# Patient Record
Sex: Male | Born: 1940 | Race: White | Hispanic: No | Marital: Married | Smoking: Never smoker
Health system: Southern US, Community
[De-identification: ages and names within clinical notes are randomized; demographics above are authoritative.]

## PROBLEM LIST (undated history)

## (undated) DIAGNOSIS — Z9289 Personal history of other medical treatment: Secondary | ICD-10-CM

## (undated) DIAGNOSIS — R7303 Prediabetes: Secondary | ICD-10-CM

## (undated) DIAGNOSIS — H269 Unspecified cataract: Secondary | ICD-10-CM

## (undated) DIAGNOSIS — I10 Essential (primary) hypertension: Secondary | ICD-10-CM

## (undated) DIAGNOSIS — I471 Supraventricular tachycardia, unspecified: Secondary | ICD-10-CM

## (undated) DIAGNOSIS — E119 Type 2 diabetes mellitus without complications: Secondary | ICD-10-CM

## (undated) DIAGNOSIS — Z973 Presence of spectacles and contact lenses: Secondary | ICD-10-CM

## (undated) DIAGNOSIS — E7800 Pure hypercholesterolemia, unspecified: Secondary | ICD-10-CM

## (undated) DIAGNOSIS — M5126 Other intervertebral disc displacement, lumbar region: Secondary | ICD-10-CM

## (undated) HISTORY — PX: TONSILLECTOMY: SUR1361

## (undated) HISTORY — PX: COLONOSCOPY: SHX174

---

## 2006-09-20 ENCOUNTER — Ambulatory Visit: Payer: Self-pay | Admitting: Internal Medicine

## 2006-10-04 ENCOUNTER — Ambulatory Visit: Payer: Self-pay | Admitting: Internal Medicine

## 2011-06-06 DIAGNOSIS — Z9289 Personal history of other medical treatment: Secondary | ICD-10-CM

## 2011-06-06 HISTORY — DX: Personal history of other medical treatment: Z92.89

## 2012-04-29 ENCOUNTER — Encounter: Payer: Self-pay | Admitting: Cardiology

## 2012-04-29 ENCOUNTER — Ambulatory Visit (INDEPENDENT_AMBULATORY_CARE_PROVIDER_SITE_OTHER): Payer: Medicare Other | Admitting: Cardiology

## 2012-04-29 VITALS — BP 132/68 | HR 72 | Resp 18 | Ht 68.0 in | Wt 204.0 lb

## 2012-04-29 DIAGNOSIS — E7800 Pure hypercholesterolemia, unspecified: Secondary | ICD-10-CM

## 2012-04-29 DIAGNOSIS — R0989 Other specified symptoms and signs involving the circulatory and respiratory systems: Secondary | ICD-10-CM

## 2012-04-29 DIAGNOSIS — I4949 Other premature depolarization: Secondary | ICD-10-CM

## 2012-04-29 DIAGNOSIS — I493 Ventricular premature depolarization: Secondary | ICD-10-CM

## 2012-04-29 DIAGNOSIS — I119 Hypertensive heart disease without heart failure: Secondary | ICD-10-CM | POA: Insufficient documentation

## 2012-04-29 DIAGNOSIS — R0609 Other forms of dyspnea: Secondary | ICD-10-CM | POA: Insufficient documentation

## 2012-04-29 NOTE — Progress Notes (Signed)
James Jacobs Date of Birth:  01-30-1941 [facility] [address] [place], [place]  [address] [phone]         Fax   [phone]  History of Present Illness: This pleasant 71 year old gentleman is seen at the request of Dr. Waynard Edwards for evaluation of exertional dyspnea and multiple cardiac risk factors.  The patient has been in good general health.  He splits his time between living here in [place] and living in [place].  He has a past history of high blood pressure which was diagnosed at age 15.  He is also diabetic and has a history of hypercholesterolemia.  He has never smoked cigarettes.  He exercises by working in the yard and when he is in [place] he also rides a bike on level terrain.  He has been experiencing some exertional dyspnea but no exertional chest pain.  He states that his last stress test was about 20 years ago elsewhere.  He states that while in [place] this summer he was checked for an abdominal aortic aneurysm with an ultrasound because several family members have had abdominal aortic aneurysms.  His evaluation was negative however.  Current Outpatient Prescriptions  Medication Sig Dispense Refill  . ALPRAZolam (XANAX) 0.25 MG tablet Take 0.25 mg by mouth at bedtime as needed.      Marland Kitchen amLODipine (NORVASC) 10 MG tablet Take 10 mg by mouth daily.      . Ascorbic Acid (C-1000 SR) 1000 MG TBCR Take 1 tablet by mouth daily.      Marland Kitchen aspirin 81 MG tablet Take 81 mg by mouth daily.      . cholecalciferol (VITAMIN D) 1000 UNITS tablet Take 1,000 Units by mouth daily.      Marland Kitchen ezetimibe (ZETIA) 10 MG tablet Take 10 mg by mouth daily.      . fish oil-omega-3 fatty acids 1000 MG capsule Take 2 g by mouth daily.      . hydrochlorothiazide (HYDRODIURIL) 25 MG tablet Take 25 mg by mouth daily.      . Multiple Vitamin (MULTIVITAMIN WITH MINERALS) TABS Take 1 tablet by mouth daily.      . potassium chloride (K-DUR) 10 MEQ tablet Take 10 mEq by mouth 2 (two)  times daily.      . quinapril (ACCUPRIL) 40 MG tablet Take 40 mg by mouth at bedtime.      . tadalafil (CIALIS) 5 MG tablet Take 5 mg by mouth daily as needed.        No Known Allergies  Patient Active Problem List  Diagnosis  . Dyspnea on exertion  . Pure hypercholesterolemia  . Benign hypertensive heart disease without heart failure    History  Smoking status  . Never Smoker   Smokeless tobacco  . Not on file    History  Alcohol Use: Not on file    No family history on file.  Review of Systems: Constitutional: no fever chills diaphoresis or fatigue or change in weight.  Head and neck: no hearing loss, no epistaxis, no photophobia or visual disturbance. Respiratory: No cough, shortness of breath or wheezing. Cardiovascular: No chest pain peripheral edema, palpitations. Gastrointestinal: No abdominal distention, no abdominal pain, no change in bowel habits hematochezia or melena. Genitourinary: No dysuria, no frequency, no urgency, no nocturia. Musculoskeletal:No arthralgias, no back pain, no gait disturbance or myalgias. Neurological: No dizziness, no headaches, no numbness, no seizures, no syncope, no weakness, no tremors. Hematologic: No lymphadenopathy, no easy bruising. Psychiatric:  No confusion, no hallucinations, no sleep disturbance.    Physical Exam: Filed Vitals:   04/29/12 1149  BP: 132/68  Pulse: 72  Resp: 18   the general appearance reveals a pleasant slightly overweight gentleman in no acute distress.The head and neck exam reveals pupils equal and reactive.  Extraocular movements are full.  There is no scleral icterus.  The mouth and pharynx are normal.  The neck is supple.  The carotids reveal no bruits.  The jugular venous pressure is normal.  The  thyroid is not enlarged.  There is no lymphadenopathy.  The chest is clear to percussion and auscultation.  There are no rales or rhonchi.  Expansion of the chest is symmetrical.  The precordium is quiet.  The  heart rhythm is irregular with occasional PVCs  The first heart sound is normal.  The second heart sound is physiologically split.  There is no murmur gallop rub or click.  There is no abnormal lift or heave.  The abdomen is soft and nontender.  The bowel sounds are normal.  The liver and spleen are not enlarged.  There are no abdominal masses.  There are no abdominal bruits.  Extremities reveal good pedal pulses.  There is no phlebitis or edema.  There is no cyanosis or clubbing.  Strength is normal and symmetrical in all extremities.  There is no lateralizing weakness.  There are no sensory deficits.  The skin is warm and dry.  There is no rash.  EKG today shows normal sinus rhythm with occasional PVC.  There are no ischemic changes at rest  Assessment / Plan: This gentleman has been experiencing exertional dyspnea.  Risk factors for premature coronary disease include a potential, hypercholesterolemia, and diabetes.  Examination reveals asymptomatic PVCs.  Plan: In regard to the PVCs, the patient has been drinking a moderate amount of by his T. and he will switch to water. We'll have the patient return for a treadmill Myoview stress test in the near future to evaluate his dyspnea further.  Many thanks for the opportunity to see this pleasant gentleman.

## 2012-04-29 NOTE — Patient Instructions (Signed)
Decrease your tea intake  Your physician recommends that you continue on your current medications as directed. Please refer to the Current Medication list given to you today.   Your physician has requested that you have en exercise stress myoview. For further information please visit [email or web address] Please follow instruction sheet, as given.

## 2012-05-01 ENCOUNTER — Other Ambulatory Visit: Payer: Self-pay | Admitting: Cardiovascular Disease

## 2012-05-01 ENCOUNTER — Ambulatory Visit (HOSPITAL_COMMUNITY): Payer: Medicare Other | Attending: Cardiovascular Disease | Admitting: Radiology

## 2012-05-01 VITALS — BP 148/74 | HR 66 | Ht 68.5 in | Wt 204.0 lb

## 2012-05-01 DIAGNOSIS — R079 Chest pain, unspecified: Secondary | ICD-10-CM

## 2012-05-01 DIAGNOSIS — I2119 ST elevation (STEMI) myocardial infarction involving other coronary artery of inferior wall: Secondary | ICD-10-CM

## 2012-05-01 DIAGNOSIS — E7800 Pure hypercholesterolemia, unspecified: Secondary | ICD-10-CM

## 2012-05-01 DIAGNOSIS — R0602 Shortness of breath: Secondary | ICD-10-CM

## 2012-05-01 DIAGNOSIS — R0609 Other forms of dyspnea: Secondary | ICD-10-CM

## 2012-05-01 DIAGNOSIS — R0989 Other specified symptoms and signs involving the circulatory and respiratory systems: Secondary | ICD-10-CM | POA: Insufficient documentation

## 2012-05-01 DIAGNOSIS — E119 Type 2 diabetes mellitus without complications: Secondary | ICD-10-CM | POA: Insufficient documentation

## 2012-05-01 DIAGNOSIS — I119 Hypertensive heart disease without heart failure: Secondary | ICD-10-CM

## 2012-05-01 DIAGNOSIS — I10 Essential (primary) hypertension: Secondary | ICD-10-CM | POA: Insufficient documentation

## 2012-05-01 DIAGNOSIS — R002 Palpitations: Secondary | ICD-10-CM | POA: Insufficient documentation

## 2012-05-01 MED ORDER — TECHNETIUM TC 99M SESTAMIBI GENERIC - CARDIOLITE
30.0000 | Freq: Once | INTRAVENOUS | Status: AC | PRN
  Administered 2012-05-01: 30 via INTRAVENOUS

## 2012-05-01 MED ORDER — TECHNETIUM TC 99M SESTAMIBI GENERIC - CARDIOLITE
10.0000 | Freq: Once | INTRAVENOUS | Status: AC | PRN
  Administered 2012-05-01: 10 via INTRAVENOUS

## 2012-05-01 MED ORDER — METOPROLOL TARTRATE 25 MG PO TABS: 25.0000 mg | ORAL_TABLET | Freq: Two times a day (BID) | ORAL | Status: DC

## 2012-05-01 NOTE — Progress Notes (Signed)
Mr. 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  He has mildly decreased uptake in the inferior wall.  The defect is mostly fixed with a small amount of redistribution.  He has not had any angina.  Will start Metoprolol 25 mg BID.  He will follow up with [place] in the next month or so.   He will call for any further symptoms.   Vesta Mixer, Montez Hageman., MD, [facility] 05/01/2012, 4:51 PM Office - [phone] Pager [phone]

## 2012-05-01 NOTE — Progress Notes (Signed)
[facility] SITE 3 NUCLEAR MED [address] [id number] [place] [address] [phone]  Cardiology Nuclear Med Study  James Jacobs is a 71 y.o. male     MRN : [id number]     DOB: 1941-02-14  Procedure Date: 05/01/2012  Nuclear Med Background Indication for Stress Test:  Evaluation for Ischemia; PVC's seen on EKG History:  Stress test 20 yrs ago ok per pt Cardiac Risk Factors: Hypertension, Lipids and NIDDM  Symptoms:  DOE and Palpitations   Nuclear Pre-Procedure Caffeine/Decaff Intake:  None NPO After: 6 pm   Lungs:  Clear. O2 Sat: 95% on room air. IV 0.9% NS with Angio Cath:  22g  IV Site: R Antecubital  IV Started by:  Milana Na, EMT-P  Chest Size (in):  44 Cup Size: n/a  Height: 5' 8.5" (1.74 m)  Weight:  204 lb (92.534 kg)  BMI:  Body mass index is 30.57 kg/(m^2). Tech Comments:  AM Med's. taken     Nuclear Med Study 1 or 2 day study: 1 day  Stress Test Type:  Stress  Reading MD: Kristeen Miss, MD  Order Authorizing Provider:  Cassell Clement, MD  Resting Radionuclide: Technetium 97m Sestamibi  Resting Radionuclide Dose: 8.8 mCi   Stress Radionuclide:  Technetium 41m Sestamibi  Stress Radionuclide Dose: 31.5 mCi           Stress Protocol Rest HR: 66 Stress HR: 125  Rest BP: 148/74 Stress BP: 194/87  Exercise Time (min): 8:31 METS: 10.1   Predicted Max HR: 147 bpm % Max HR: 85.03 bpm Rate Pressure Product: 47425   Dose of Adenosine (mg):  n/a Dose of Lexiscan: n/a mg  Dose of Atropine (mg): n/a Dose of Dobutamine: n/a mcg/kg/min (at max HR)  Stress Test Technologist: Smiley Houseman, CMA-N  Nuclear Technologist:  Doyne Keel, CNMT     Rest Procedure:  Myocardial perfusion imaging was performed at rest 45 minutes following the intravenous administration of Technetium 40m Sestamibi.  Rest ECG: Occasional PVC's, otherwise WNL.  Stress Procedure:  The patient exercised on the treadmill utilizing the Bruce protocol for 8:31  minutes. He then stopped due to fatigue and dyspnea with peak O2 SAT of 97%.  He did c/o mild chest tightness, 2-3/10, at peak exercise.  There were no diagnostic ST-T wave changes, but there were nonspecific ST-T wave changes noted with occasional PVC's.  Technetium 34m Sestamibi was injected at peak exercise and myocardial perfusion imaging was performed after a brief delay.  EKG's and images were discussed with Dr. Johney Frame, DOD, and he felt it was safe for the patient to leave and follow up with Dr. Patty Sermons.  Stress ECG: No significant change from baseline ECG  QPS Raw Data Images:  Normal; no motion artifact; normal heart/lung ratio. Stress Images:  There is decreased uptake in the mid and basal inferior wall with normal uptake in the remaining regions.  ( SSS=7) Rest Images:  There is decreased uptake in the mid and basal inferior wall with normal uptake in the remaining regions. [facility] = 3) Subtraction (SDS):  There is a very small amount of redistribution in the basal inferior wall.   (SDS = 4) Transient Ischemic Dilatation (Normal <1.22):  1.00 Lung/Heart Ratio (Normal <0.45):  0.35  Quantitative Gated Spect Images QGS EDV:  109 ml QGS ESV:  53 ml  Impression Exercise Capacity:  Good exercise capacity. BP Response:  Normal blood pressure response. Clinical Symptoms:  No significant symptoms noted. ECG Impression:  No significant ST segment change suggestive of ischemia. Comparison with Prior Nuclear Study: No previous nuclear study performed  Overall Impression:  Low risk stress nuclear study.  There is evidence of a previous inferior basal MI with a small area of redistribution.   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  There is not a significant amount of ischemia.   His overall LV function is at the lower limits of normal.  He has moderate-severe hypokinesis of the inferior base.   LV Ejection Fraction: 51%.    I talked to [name].  He has not had any episodes of chest pain.   He has diabetes.   He is having PVCs.  Will prescribe Metoprolol 25  Mg BID.  He will follow up with [place] in a month or sooner if needed   .Vesta Mixer, Montez Hageman., MD, [facility] 05/01/2012, 4:22 PM Office - [phone] Pager [phone]

## 2012-05-20 ENCOUNTER — Ambulatory Visit: Payer: Self-pay | Admitting: Cardiovascular Disease

## 2012-06-04 ENCOUNTER — Encounter: Payer: Self-pay | Admitting: Cardiology

## 2012-06-04 ENCOUNTER — Ambulatory Visit (INDEPENDENT_AMBULATORY_CARE_PROVIDER_SITE_OTHER): Payer: Medicare Other | Admitting: Cardiology

## 2012-06-04 VITALS — BP 113/64 | HR 67 | Resp 18 | Ht 68.0 in | Wt 207.0 lb

## 2012-06-04 DIAGNOSIS — I119 Hypertensive heart disease without heart failure: Secondary | ICD-10-CM

## 2012-06-04 DIAGNOSIS — R0989 Other specified symptoms and signs involving the circulatory and respiratory systems: Secondary | ICD-10-CM

## 2012-06-04 MED ORDER — METOPROLOL SUCCINATE ER 25 MG PO TB24: 25.0000 mg | ORAL_TABLET | Freq: Every day | ORAL | Status: DC

## 2012-06-04 NOTE — Progress Notes (Signed)
James Jacobs Date of Birth:  [birth year] [facility] [address] [place], [place]  [address] [phone]  Fax   [phone]  HPI: This pleasant 71 year old gentleman was seen at the request of Dr. Waynard Edwards for evaluation of exertional dyspnea and multiple cardiac risk factors. The patient has been in good general health. He splits his time between living here in [place] and living in [place]. He has a past history of high blood pressure which was diagnosed at age 27. He is also diabetic and has a history of hypercholesterolemia. He has never smoked cigarettes. He exercises by working in the yard and when he is in [place] he also rides a bike on level terrain. He has been experiencing some exertional dyspnea but no exertional chest pain.  Patient underwent a stress Myoview on 05/01/12.  He walked for almost 9 minutes.  He tolerated the exercise well.  Perfusion imaging raise the question of a possible small old inferior wall scar.  His overall ejection fraction was 51%.  He was placed on Lopressor 25 mg twice a day.  On his own he reduced the dose to once a day because of malaise and fatigue.   Current Outpatient Prescriptions  Medication Sig Dispense Refill  . amLODipine (NORVASC) 10 MG tablet Take 10 mg by mouth daily.      . Ascorbic Acid (C-1000 SR) 1000 MG TBCR Take 1 tablet by mouth daily.      Marland Kitchen aspirin 81 MG tablet Take 81 mg by mouth daily.      . cholecalciferol (VITAMIN D) 1000 UNITS tablet Take 1,000 Units by mouth daily.      Marland Kitchen ezetimibe (ZETIA) 10 MG tablet Take 10 mg by mouth daily.      . fish oil-omega-3 fatty acids 1000 MG capsule Take 2 g by mouth daily.      . hydrochlorothiazide (HYDRODIURIL) 25 MG tablet Take 25 mg by mouth daily.      . Multiple Vitamin (MULTIVITAMIN WITH MINERALS) TABS Take 1 tablet by mouth daily.      . potassium chloride (K-DUR) 10 MEQ tablet Take 10 mEq by mouth 2 (two) times daily.      . quinapril (ACCUPRIL) 40 MG  tablet Take 40 mg by mouth at bedtime.      . tadalafil (CIALIS) 5 MG tablet Take 5 mg by mouth daily as needed.      . ALPRAZolam (XANAX) 0.25 MG tablet Take 0.25 mg by mouth at bedtime as needed.      . metoprolol succinate (TOPROL-XL) 25 MG 24 hr tablet Take 1 tablet (25 mg total) by mouth daily.  90 tablet  3    No Known Allergies  Patient Active Problem List  Diagnosis  . Dyspnea on exertion  . Pure hypercholesterolemia  . Benign hypertensive heart disease without heart failure    History  Smoking status  . Never Smoker   Smokeless tobacco  . Not on file    History  Alcohol Use: Not on file    No family history on file.  Review of Systems: The patient denies any heat or cold intolerance.  No weight gain or weight loss.  The patient denies headaches or blurry vision.  There is no cough or sputum production.  The patient denies dizziness.  There is no hematuria or hematochezia.  The patient denies any muscle aches or arthritis.  The patient denies any rash.  The patient denies frequent falling or instability.  There is no history of depression or anxiety.  All other systems were reviewed and are negative.   Physical Exam: Filed Vitals:   06/04/12 0833  BP: 113/64  Pulse: 67  Resp: 18   the general appearance reveals a well-developed well-nourished gentleman in no distress.The head and neck exam reveals pupils equal and reactive.  Extraocular movements are full.  There is no scleral icterus.  The mouth and pharynx are normal.  The neck is supple.  The carotids reveal no bruits.  The jugular venous pressure is normal.  The  thyroid is not enlarged.  There is no lymphadenopathy.  The chest is clear to percussion and auscultation.  There are no rales or rhonchi.  Expansion of the chest is symmetrical.  The precordium is quiet.  The first heart sound is normal.  The second heart sound is physiologically split.  There is no murmur gallop rub or click.  There is no abnormal lift or  heave.  The abdomen is soft and nontender.  The bowel sounds are normal.  The liver and spleen are not enlarged.  There are no abdominal masses.  There are no abdominal bruits.  Extremities reveal good pedal pulses.  There is no phlebitis or edema.  There is no cyanosis or clubbing.  Strength is normal and symmetrical in all extremities.  There is no lateralizing weakness.  There are no sensory deficits.  The skin is warm and dry.  There is no rash.      Assessment / Plan: The patient is to continue current medication except we will switch him from metoprolol tartrate to metoprolol succinate 25 mg one daily for better 24-hour coverage. Encouraged him to lose weight and increase regular exercise and we will plan to recheck him in about 6 months when he returns from [place].

## 2012-06-04 NOTE — Assessment & Plan Note (Signed)
The patient has not been experiencing any recent dyspnea with exertion.  He has been relatively sedentary however.  He does intend to get more exercise when he gets down to [place].

## 2012-06-04 NOTE — Patient Instructions (Signed)
Your physician has recommended you make the following change in your medication: stop taking Lopressor and start taking Metoproplol Succinate 25 mg daily  Your physician wants you to follow-up in: 6 months. You will receive a reminder letter in the mail two months in advance. If you don't receive a letter, please call our office to schedule the follow-up appointment.

## 2012-06-04 NOTE — Assessment & Plan Note (Signed)
Patient states that blood pressure was too low for him when he was on Lopressor 25 mg twice a day.  Since dropping the dose to once a day his blood pressure has been more normal for him and he has felt better.

## 2015-05-17 ENCOUNTER — Encounter: Payer: Self-pay | Admitting: Internal Medicine

## 2016-08-30 ENCOUNTER — Encounter: Payer: Self-pay | Admitting: Internal Medicine

## 2018-04-27 ENCOUNTER — Encounter (HOSPITAL_COMMUNITY): Payer: Self-pay | Admitting: Emergency Medicine

## 2018-04-27 ENCOUNTER — Observation Stay (HOSPITAL_COMMUNITY)
Admission: EM | Admit: 2018-04-27 | Discharge: 2018-04-29 | Disposition: A | Discharge status: Home / Self Care | Payer: Medicare PPO | Attending: Internal Medicine | Admitting: Internal Medicine

## 2018-04-27 ENCOUNTER — Emergency Department (HOSPITAL_COMMUNITY): Payer: Medicare PPO

## 2018-04-27 ENCOUNTER — Other Ambulatory Visit: Payer: Self-pay

## 2018-04-27 DIAGNOSIS — Z79899 Other long term (current) drug therapy: Secondary | ICD-10-CM | POA: Insufficient documentation

## 2018-04-27 DIAGNOSIS — I10 Essential (primary) hypertension: Secondary | ICD-10-CM | POA: Diagnosis not present

## 2018-04-27 DIAGNOSIS — R7989 Other specified abnormal findings of blood chemistry: Secondary | ICD-10-CM

## 2018-04-27 DIAGNOSIS — E119 Type 2 diabetes mellitus without complications: Secondary | ICD-10-CM | POA: Insufficient documentation

## 2018-04-27 DIAGNOSIS — R778 Other specified abnormalities of plasma proteins: Secondary | ICD-10-CM

## 2018-04-27 DIAGNOSIS — I471 Supraventricular tachycardia: Principal | ICD-10-CM | POA: Insufficient documentation

## 2018-04-27 DIAGNOSIS — Z7982 Long term (current) use of aspirin: Secondary | ICD-10-CM | POA: Insufficient documentation

## 2018-04-27 DIAGNOSIS — I119 Hypertensive heart disease without heart failure: Secondary | ICD-10-CM | POA: Diagnosis not present

## 2018-04-27 DIAGNOSIS — R002 Palpitations: Secondary | ICD-10-CM

## 2018-04-27 DIAGNOSIS — E876 Hypokalemia: Secondary | ICD-10-CM

## 2018-04-27 HISTORY — DX: Type 2 diabetes mellitus without complications: E11.9

## 2018-04-27 HISTORY — DX: Essential (primary) hypertension: I10

## 2018-04-27 HISTORY — DX: Personal history of other medical treatment: Z92.89

## 2018-04-27 HISTORY — DX: Pure hypercholesterolemia, unspecified: E78.00

## 2018-04-27 LAB — BRAIN NATRIURETIC PEPTIDE: B NATRIURETIC PEPTIDE 5: 144 pg/mL — AB (ref 0.0–100.0)

## 2018-04-27 LAB — BASIC METABOLIC PANEL
ANION GAP: 11 (ref 5–15)
BUN: 16 mg/dL (ref 8–23)
CO2: 24 mmol/L (ref 22–32)
CREATININE: 1.31 mg/dL — AB (ref 0.61–1.24)
Calcium: 9.5 mg/dL (ref 8.9–10.3)
Chloride: 105 mmol/L (ref 98–111)
GFR, EST AFRICAN AMERICAN: 58 mL/min — AB (ref >60)
GFR, EST NON AFRICAN AMERICAN: 50 mL/min — AB (ref >60)
Glucose, Bld: 148 mg/dL — ABNORMAL HIGH (ref 70–99)
Potassium: 3.2 mmol/L — ABNORMAL LOW (ref 3.5–5.1)
SODIUM: 140 mmol/L (ref 135–145)

## 2018-04-27 LAB — URINALYSIS, ROUTINE W REFLEX MICROSCOPIC
Bilirubin Urine: NEGATIVE
Glucose, UA: NEGATIVE mg/dL
Hgb urine dipstick: NEGATIVE
KETONES UR: 5 mg/dL — AB
LEUKOCYTES UA: NEGATIVE
NITRITE: NEGATIVE
PROTEIN: NEGATIVE mg/dL
Specific Gravity, Urine: 1.009 (ref 1.005–1.030)
pH: 6 (ref 5.0–8.0)

## 2018-04-27 LAB — CBC
HCT: 48.8 % (ref 39.0–52.0)
Hemoglobin: 15.9 g/dL (ref 13.0–17.0)
MCH: 30.4 pg (ref 26.0–34.0)
MCHC: 32.6 g/dL (ref 30.0–36.0)
MCV: 93.3 fL (ref 80.0–100.0)
NRBC: 0 % (ref 0.0–0.2)
Platelets: 291 10*3/uL (ref 150–400)
RBC: 5.23 MIL/uL (ref 4.22–5.81)
RDW: 13.2 % (ref 11.5–15.5)
WBC: 11.5 10*3/uL — AB (ref 4.0–10.5)

## 2018-04-27 LAB — DIFFERENTIAL
ABS IMMATURE GRANULOCYTES: 0.04 10*3/uL (ref 0.00–0.07)
Basophils Absolute: 0.1 10*3/uL (ref 0.0–0.1)
Basophils Relative: 1 %
EOS PCT: 1 %
Eosinophils Absolute: 0.2 10*3/uL (ref 0.0–0.5)
Immature Granulocytes: 0 %
Lymphocytes Relative: 35 %
Lymphs Abs: 3.9 10*3/uL (ref 0.7–4.0)
MONO ABS: 1.1 10*3/uL — AB (ref 0.1–1.0)
MONOS PCT: 10 %
NEUTROS PCT: 53 %
Neutro Abs: 5.8 10*3/uL (ref 1.7–7.7)

## 2018-04-27 LAB — MAGNESIUM: Magnesium: 2.1 mg/dL (ref 1.7–2.4)

## 2018-04-27 LAB — TROPONIN I
TROPONIN I: 0.06 ng/mL — AB (ref <0.03)
Troponin I: 0.15 ng/mL (ref <0.03)

## 2018-04-27 LAB — CBG MONITORING, ED: Glucose-Capillary: 146 mg/dL — ABNORMAL HIGH (ref 70–99)

## 2018-04-27 MED ORDER — METOPROLOL TARTRATE 5 MG/5ML IV SOLN
5.0000 mg | Freq: Once | INTRAVENOUS | Status: AC
  Administered 2018-04-27: 5 mg via INTRAVENOUS
  Filled 2018-04-27: qty 5

## 2018-04-27 MED ORDER — HYDROCHLOROTHIAZIDE 25 MG PO TABS
25.0000 mg | ORAL_TABLET | Freq: Every day | ORAL | Status: DC
  Administered 2018-04-28 – 2018-04-29 (×2): 25 mg via ORAL
  Filled 2018-04-27 (×2): qty 1

## 2018-04-27 MED ORDER — ACETAMINOPHEN 325 MG PO TABS: 650.0000 mg | ORAL_TABLET | Freq: Four times a day (QID) | ORAL | Status: DC | PRN

## 2018-04-27 MED ORDER — QUINAPRIL HCL 10 MG PO TABS: 40.0000 mg | ORAL_TABLET | Freq: Every day | ORAL | Status: DC

## 2018-04-27 MED ORDER — LISINOPRIL 10 MG PO TABS
40.0000 mg | ORAL_TABLET | Freq: Every day | ORAL | Status: DC
  Administered 2018-04-28 – 2018-04-29 (×2): 40 mg via ORAL
  Filled 2018-04-27 (×2): qty 4

## 2018-04-27 MED ORDER — METOPROLOL TARTRATE 5 MG/5ML IV SOLN
INTRAVENOUS | Status: AC
  Filled 2018-04-27: qty 5

## 2018-04-27 MED ORDER — ENOXAPARIN SODIUM 40 MG/0.4ML ~~LOC~~ SOLN
40.0000 mg | SUBCUTANEOUS | Status: DC
  Administered 2018-04-27 – 2018-04-28 (×2): 40 mg via SUBCUTANEOUS
  Filled 2018-04-27 (×2): qty 0.4

## 2018-04-27 MED ORDER — ROSUVASTATIN CALCIUM 10 MG PO TABS
5.0000 mg | ORAL_TABLET | Freq: Every day | ORAL | Status: DC
  Administered 2018-04-28 – 2018-04-29 (×2): 5 mg via ORAL
  Filled 2018-04-27 (×2): qty 1

## 2018-04-27 MED ORDER — ONDANSETRON HCL 4 MG/2ML IJ SOLN: 4.0000 mg | Freq: Four times a day (QID) | INTRAMUSCULAR | Status: DC | PRN

## 2018-04-27 MED ORDER — DILTIAZEM HCL ER COATED BEADS 120 MG PO CP24
120.0000 mg | ORAL_CAPSULE | Freq: Every day | ORAL | Status: DC
  Administered 2018-04-27 – 2018-04-29 (×3): 120 mg via ORAL
  Filled 2018-04-27 (×6): qty 1

## 2018-04-27 MED ORDER — ASPIRIN EC 81 MG PO TBEC
81.0000 mg | DELAYED_RELEASE_TABLET | Freq: Every day | ORAL | Status: DC
  Administered 2018-04-28 – 2018-04-29 (×2): 81 mg via ORAL
  Filled 2018-04-27 (×2): qty 1

## 2018-04-27 MED ORDER — POTASSIUM CHLORIDE CRYS ER 20 MEQ PO TBCR
40.0000 meq | EXTENDED_RELEASE_TABLET | Freq: Once | ORAL | Status: AC
  Administered 2018-04-27: 40 meq via ORAL
  Filled 2018-04-27: qty 2

## 2018-04-27 MED ORDER — EZETIMIBE 10 MG PO TABS
10.0000 mg | ORAL_TABLET | Freq: Every day | ORAL | Status: DC
  Administered 2018-04-28 – 2018-04-29 (×2): 10 mg via ORAL
  Filled 2018-04-27 (×2): qty 1

## 2018-04-27 MED ORDER — METOPROLOL TARTRATE 5 MG/5ML IV SOLN: 5.0000 mg | Freq: Once | INTRAVENOUS | Status: DC

## 2018-04-27 MED ORDER — ONDANSETRON HCL 4 MG PO TABS: 4.0000 mg | ORAL_TABLET | Freq: Four times a day (QID) | ORAL | Status: DC | PRN

## 2018-04-27 MED ORDER — SODIUM CHLORIDE 0.9 % IV SOLN
INTRAVENOUS | Status: DC
  Administered 2018-04-27 – 2018-04-29 (×4): via INTRAVENOUS

## 2018-04-27 MED ORDER — SENNOSIDES-DOCUSATE SODIUM 8.6-50 MG PO TABS: 1.0000 | ORAL_TABLET | Freq: Every evening | ORAL | Status: DC | PRN

## 2018-04-27 MED ORDER — ACETAMINOPHEN 650 MG RE SUPP: 650.0000 mg | Freq: Four times a day (QID) | RECTAL | Status: DC | PRN

## 2018-04-27 MED ORDER — SODIUM CHLORIDE 0.9 % IV BOLUS
250.0000 mL | Freq: Once | INTRAVENOUS | Status: AC
  Administered 2018-04-27: 250 mL via INTRAVENOUS

## 2018-04-27 MED ORDER — ALPRAZOLAM 0.25 MG PO TABS: 0.2500 mg | ORAL_TABLET | Freq: Every evening | ORAL | Status: DC | PRN

## 2018-04-27 NOTE — ED Provider Notes (Signed)
[facility] EMERGENCY DEPARTMENT Provider Note   CSN: [id number] Arrival date & time: 04/27/18  1214     History   Chief Complaint Chief Complaint  Patient presents with  . Dizziness  . Palpitations    HPI James Jacobs is a 77 y.o. male.  HPI Pt was seen at 1240. Per pt, c/o gradual onset and persistence of multiple intermittent episodes of "spells" for the past 4 months. Pt describes these "spells" as: lightheadedness, flushing, palpitations. States he has taken his HR during one of these episodes and it has been in the "140's." States his "BP also goes up when I get like that." Pt states he took lopressor several years ago, but self-discontinued due to side effect of generalized fatigue. Denies CP, no SOB/cough, no abd pain, no N/V/D, no back pain, no syncope, no vertigo, no focal motor weakness, no tingling/numbness in extremities.    Past Medical History:  Diagnosis Date  . Diabetes mellitus without complication (HCC)   . History of nuclear stress test 2013   low risk study  . Hypercholesteremia   . Hypertension     Patient Active Problem List   Diagnosis Date Noted  . Dyspnea on exertion 04/29/2012  . Pure hypercholesterolemia 04/29/2012  . Benign hypertensive heart disease without heart failure 04/29/2012    History reviewed. No pertinent surgical history.      Home Medications    Prior to Admission medications   Medication Sig Start Date End Date Taking? Authorizing Provider  ALPRAZolam (XANAX) 0.25 MG tablet Take 0.25 mg by mouth at bedtime as needed.   Yes [provider]  amLODipine (NORVASC) 10 MG tablet Take 10 mg by mouth daily.   Yes [provider]  Ascorbic Acid (C-1000 SR) 1000 MG TBCR Take 1 tablet by mouth daily.   Yes [provider]  aspirin 81 MG tablet Take 81 mg by mouth daily.   Yes [provider]  cholecalciferol (VITAMIN D) 1000 UNITS tablet Take 1,000 Units by mouth daily.   Yes [provider]  ezetimibe (ZETIA) 10 MG tablet Take 10 mg by mouth daily.   Yes [provider]  hydrochlorothiazide (HYDRODIURIL) 25 MG tablet Take 25 mg by mouth daily.   Yes [provider]  ibandronate (BONIVA) 150 MG tablet Take 1 tablet by mouth every 30 (thirty) days. 03/27/18  Yes [provider]  Multiple Vitamin (MULTIVITAMIN WITH MINERALS) TABS Take 1 tablet by mouth daily.   Yes [provider]  quinapril (ACCUPRIL) 40 MG tablet Take 40 mg by mouth at bedtime.   Yes [provider]  rosuvastatin (CRESTOR) 5 MG tablet Take 5 mg by mouth daily.   Yes [provider]  tadalafil (CIALIS) 5 MG tablet Take 5 mg by mouth daily as needed.   Yes [provider]    Family History No family history on file.  Social History Social History   Tobacco Use  . Smoking status: Never Smoker  . Smokeless tobacco: Never Used  Substance Use Topics  . Alcohol use: Yes  . Drug use: Never     Allergies   Patient has no known allergies.   Review of Systems Review of Systems ROS: Statement: All systems negative except as marked or noted in the HPI; Constitutional: Negative for fever and chills. +flushing sensation.; ; Eyes: Negative for eye pain, redness and discharge. ; ; ENMT: Negative for ear pain, hoarseness, nasal congestion, sinus pressure and sore throat. ; ;  Cardiovascular: +palpitations. Negative for chest pain, diaphoresis, dyspnea and peripheral edema. ; ; Respiratory: Negative for cough, wheezing and stridor. ; ; Gastrointestinal: Negative for nausea, vomiting, diarrhea, abdominal pain, blood in stool, hematemesis, jaundice and rectal bleeding. . ; ; Genitourinary: Negative for dysuria, flank pain and hematuria. ; ; Musculoskeletal: Negative for back pain and neck pain. Negative for swelling and trauma.; ; Skin: Negative for pruritus, rash, abrasions, blisters, bruising and skin lesion.; ; Neuro: +lightheadedness. Negative for  headache and neck stiffness. Negative for weakness, altered level of consciousness, altered mental status, extremity weakness, paresthesias, involuntary movement, seizure and syncope.      Physical Exam Updated Vital Signs BP (!) 151/132 (BP Location: Right Arm)   Pulse (!) 128   Temp 97.7 F (36.5 C) (Oral)   Ht 5\' 8"  (1.727 m)   Wt 83.9 kg   SpO2 99%   BMI 28.13 kg/m    Patient Vitals for the past 24 hrs:  BP Temp Temp src Pulse Resp SpO2 Height Weight  04/27/18 1430 130/83 - - 87 (!) 21 97 % - -  04/27/18 1400 (!) 144/84 - - 91 18 97 % - -  04/27/18 1331 132/80 - - 97 - 94 % - -  04/27/18 1315 - - - 94 (!) 21 97 % - -  04/27/18 1314 - - - (!) 101 17 97 % - -  04/27/18 1313 - - - 97 (!) 23 95 % - -  04/27/18 1312 - - - 97 (!) 23 97 % - -  04/27/18 1311 - - - (!) 102 19 95 % - -  04/27/18 1310 - - - (!) 119 (!) 23 97 % - -  04/27/18 1309 - - - (!) 143 (!) 22 97 % - -  04/27/18 1308 - - - (!) 143 (!) 23 97 % - -  04/27/18 1307 - - - (!) 144 (!) 22 98 % - -  04/27/18 1306 - - - (!) 141 16 98 % - -  04/27/18 1305 101/88 - - (!) 145 (!) 21 97 % - -  04/27/18 1304 - - - (!) 146 (!) 23 97 % - -  04/27/18 1303 - - - (!) 146 17 97 % - -  04/27/18 1302 - - - (!) 149 (!) 23 98 % - -  04/27/18 1301 - - - (!) 149 (!) 24 97 % - -  04/27/18 1300 - - - (!) 146 20 96 % - -  04/27/18 1259 - - - (!) 151 (!) 23 96 % - -  04/27/18 1258 - - - (!) 102 12 96 % - -  04/27/18 1257 - - - 97 17 98 % - -  04/27/18 1225 - - - - - - 5\' 8"  (1.727 m) 83.9 kg  04/27/18 1224 (!) 151/132 97.7 F (36.5 C) Oral (!) 128 - 99 % - -     Physical Exam 1245: Physical examination:  Nursing notes reviewed; Vital signs and O2 SAT reviewed;  Constitutional: Well developed, Well nourished, Well hydrated, In no acute distress; Head:  Normocephalic, atraumatic; Eyes: EOMI, PERRL, No scleral icterus; ENMT: Mouth and pharynx normal, Mucous membranes moist; Neck: Supple, Full range of motion, No lymphadenopathy;  Cardiovascular: Tachycardic rate and rhythm, rates 90-110's during my exam. No gallop; Respiratory: Breath sounds clear & equal bilaterally, No wheezes.  Speaking full sentences with ease, Normal respiratory effort/excursion; Chest: Nontender, Movement normal; Abdomen: Soft, Nontender, Nondistended, Normal bowel sounds; Genitourinary:  No CVA tenderness; Extremities: Peripheral pulses normal, No tenderness, No edema, No calf edema or asymmetry.; Neuro: AA&Ox3, Major CN grossly intact.  Speech clear. No gross focal motor or sensory deficits in extremities.; Skin: Color normal, Warm, Dry.   ED Treatments / Results  Labs (all labs ordered are listed, but only abnormal results are displayed)   EKG EKG Interpretation  Date/Time:  Saturday April 27 2018 12:36:11 EST Ventricular Rate:  103 PR Interval:    QRS Duration: 94 QT Interval:  336 QTC Calculation: 440 R Axis:   54 Text Interpretation:  Sinus tachycardia Prolonged PR interval Minimal ST depression, inferior leads Baseline wander No old tracing to compare Confirmed by Samuel Jester [phone]) on 04/27/2018 12:55:53 PM   EKG Interpretation  Date/Time:  Saturday April 27 2018 13:01:56 EST Ventricular Rate:  149 PR Interval:    QRS Duration: 92 QT Interval:  327 QTC Calculation: 515 R Axis:   59 Text Interpretation:  Supraventricular tachycardia ST depression, probably rate related Artifact Since last tracing of earlier today Supraventricular tachycardia is now Present Confirmed by Samuel Jester [phone]) on 04/27/2018 1:14:19 PM        Radiology   Procedures Procedures (including critical care time)  Medications Ordered in ED Medications  metoprolol tartrate (LOPRESSOR) injection 5 mg ( Intravenous Not Given 04/27/18 1311)  metoprolol tartrate (LOPRESSOR) injection 5 mg (5 mg Intravenous Not Given 04/27/18 1311)  sodium chloride 0.9 % bolus 250 mL (has no administration in time range)  0.9 %  sodium chloride  infusion (has no administration in time range)     Initial Impression / Assessment and Plan / ED Course  I have reviewed the triage vital signs and the nursing notes.  Pertinent labs & imaging results that were available during my care of the patient were reviewed by me and considered in my medical decision making (see chart for details).  MDM Reviewed: previous chart, nursing note and vitals Reviewed previous: labs and ECG Interpretation: labs, ECG and x-ray Total time providing critical care: 30-74 minutes. This excludes time spent performing separately reportable procedures and services. Consults: cardiology   CRITICAL CARE Performed by: Samuel Jester Total critical care time: 45 minutes Critical care time was exclusive of separately billable procedures and treating other patients. Critical care was necessary to treat or prevent imminent or life-threatening deterioration. Critical care was time spent personally by me on the following activities: development of treatment plan with patient and/or surrogate as well as nursing, discussions with consultants, evaluation of patient's response to treatment, examination of patient, obtaining history from patient or surrogate, ordering and performing treatments and interventions, ordering and review of laboratory studies, ordering and review of radiographic studies, pulse oximetry and re-evaluation of patient's condition.   Results for orders placed or performed during the hospital encounter of 04/27/18  Basic metabolic panel  Result Value Ref Range   Sodium 140 135 - 145 mmol/L   Potassium 3.2 (L) 3.5 - 5.1 mmol/L   Chloride 105 98 - 111 mmol/L   CO2 24 22 - 32 mmol/L   Glucose, Bld 148 (H) 70 - 99 mg/dL   BUN 16 8 - 23 mg/dL   Creatinine, Ser 8.29 (H) 0.61 - 1.24 mg/dL   Calcium 9.5 8.9 - 56.2 mg/dL   GFR calc non Af Amer 50 (L) >60 mL/min   GFR calc Af Amer 58 (L) >60 mL/min   Anion gap 11 5 - 15  CBC  Result Value Ref Range  WBC 11.5 (H) 4.0 - 10.5 K/uL   RBC 5.23 4.22 - 5.81 MIL/uL   Hemoglobin 15.9 13.0 - 17.0 g/dL   HCT 81.1 91.4 - 78.2 %   MCV 93.3 80.0 - 100.0 fL   MCH 30.4 26.0 - 34.0 pg   MCHC 32.6 30.0 - 36.0 g/dL   RDW 95.6 21.3 - 08.6 %   Platelets 291 150 - 400 K/uL   nRBC 0.0 0.0 - 0.2 %  Urinalysis, Routine w reflex microscopic  Result Value Ref Range   Color, Urine YELLOW YELLOW   APPearance CLEAR CLEAR   Specific Gravity, Urine 1.009 1.005 - 1.030   pH 6.0 5.0 - 8.0   Glucose, UA NEGATIVE NEGATIVE mg/dL   Hgb urine dipstick NEGATIVE NEGATIVE   Bilirubin Urine NEGATIVE NEGATIVE   Ketones, ur 5 (A) NEGATIVE mg/dL   Protein, ur NEGATIVE NEGATIVE mg/dL   Nitrite NEGATIVE NEGATIVE   Leukocytes, UA NEGATIVE NEGATIVE  Brain natriuretic peptide  Result Value Ref Range   B Natriuretic Peptide 144.0 (H) 0.0 - 100.0 pg/mL  Troponin I - Once  Result Value Ref Range   Troponin I 0.06 (HH) <0.03 ng/mL  Differential  Result Value Ref Range   Neutrophils Relative % 53 %   Neutro Abs 5.8 1.7 - 7.7 K/uL   Lymphocytes Relative 35 %   Lymphs Abs 3.9 0.7 - 4.0 K/uL   Monocytes Relative 10 %   Monocytes Absolute 1.1 (H) 0.1 - 1.0 K/uL   Eosinophils Relative 1 %   Eosinophils Absolute 0.2 0.0 - 0.5 K/uL   Basophils Relative 1 %   Basophils Absolute 0.1 0.0 - 0.1 K/uL   Immature Granulocytes 0 %   Abs Immature Granulocytes 0.04 0.00 - 0.07 K/uL  Magnesium  Result Value Ref Range   Magnesium 2.1 1.7 - 2.4 mg/dL  CBG monitoring, ED  Result Value Ref Range   Glucose-Capillary 146 (H) 70 - 99 mg/dL   Dg Chest 2 View Result Date: 04/27/2018 CLINICAL DATA:  Dizziness and irregular heartbeat for several weeks, history diabetes mellitus, hypertension EXAM: CHEST - 2 VIEW COMPARISON:  None FINDINGS: Normal heart size, mediastinal contours, and pulmonary vascularity. Atherosclerotic calcification aorta. Mild elevation of LEFT diaphragm. No acute infiltrate, pleural effusion or pneumothorax. Diffuse  osseous demineralization. IMPRESSION: No acute abnormalities. Electronically Signed   By: Ulyses Southward M.D.   On: 04/27/2018 14:01     1310:  During my exam, pt's monitor with tachycardia, rates 90-110's. ED RN called me into exam room: pt c/o "one of the spells," ie: lightheadedness, flushing, palpitations. Monitor narrow complex tachycardia, rates 140-150's. Pt A&O, resps easy, appears NAD. Denies CP. Minimal response to vagal maneuvers. Pt states he has taken lopressor previously. IV metoprolol ordered. As ED RN cleaned off IV site, pt's HR decreased spontaneously to 90's, NSR. Pt stated his symptoms were resolved.   1450:  Pt remains with stable BP, HR 90's/regular. BP stable. No further episodes of tachycardia while in the ED. Potassium repleted PO.  T/C returned from [facility] Cards Dr. Royann Shivers, case discussed, including:  HPI, pertinent PM/SHx, VS/PE, dx testing, ED course and treatment:  Agrees regarding admission, OK to stay at [facility], recommends to cycle troponin, d/c amlodipine and start CCB such as diltiazem/verapamil (given pt's s/e when on B-blocker previously), if can obtain echocardiogram during admit would be good/otherwise obtain as outpatient is fine, have pt f/u with Cards MD in [place].   1510:  Dx and testing d/w pt and family.  Questions answered.  Verb understanding, agreeable to admit.  T/C returned from [facility] Dr. Adrian BlackwaterStinson, case discussed, including:  HPI, pertinent PM/SHx, VS/PE, dx testing, ED course and treatment:  Agreeable to admit.       Final Clinical Impressions(s) / ED Diagnoses   Final diagnoses:  None    ED Discharge Orders    None       Samuel JesterMcManus, Jayvion Stefanski, DO 04/28/18 2140

## 2018-04-27 NOTE — ED Notes (Signed)
CRITICAL VALUE ALERT  Critical Value:  Troponin 0.06  Date & Time Notied:  04/27/18 @ 1411  Provider Notified: Dr. Charm BargesButler  Orders Received/Actions taken: see new orders.

## 2018-04-27 NOTE — Progress Notes (Signed)
Pt's HR sustaining 130's-140's on telemetry. Dr. Adrian BlackwaterStinson paged and made aware. Only symptom patient presents with is a "knot in my throat". Pt states "this is how it feels every time". Will continue to monitor.

## 2018-04-27 NOTE — Progress Notes (Signed)
CRITICAL VALUE ALERT  Critical Value:  Troponin 0.15  Date & Time Notied:  04/27/18 2102  Provider Notified: Midlevel  Orders Received/Actions taken: No new orders received at this time. Will continue to monitor patient.

## 2018-04-27 NOTE — H&P (Signed)
History and Physical  James Jacobs ZOX:[id number] DOB: 14-Jan-1941 DOA: 04/27/2018  Referring physician: Dr Clarene DukeMcManus, ED physician PCP: Rodrigo RanPerini, Mark, MD  Outpatient Specialists: none  Patient Coming From: home  Chief Complaint: Dizziness  HPI: James Jacobs is a 77 y.o. male with a history of hypertension, hyper cholesterolemia, diabetes.  Patient has been having intermittent dizziness spells over the past 3 months.  Spells are becoming more frequent.  Today he had a couple of spells and decided to come in for evaluation.  No palliating or provoking factors.  He has noted that his blood pressure is usually in the 140 range systolically and tachycardia when he has these spells.  Denies chest pain, shortness of breath during the episodes.  Emergency Department Course: Patient noted to be in SVT during 1 of the episodes in the ED.  Did not respond to vagal maneuvers.  As patient was about to get metoprolol IV, the patient's SVT broke.  Review of Systems:   Pt denies any fevers, chills, nausea, vomiting, diarrhea, constipation, abdominal pain, shortness of breath, dyspnea on exertion, orthopnea, cough, wheezing, palpitations, headache, vision changes, lightheadedness, dizziness, melena, rectal bleeding.  Review of systems are otherwise negative  Past Medical History:  Diagnosis Date  . Diabetes mellitus without complication (HCC)   . History of nuclear stress test 2013   low risk study  . Hypercholesteremia   . Hypertension    History reviewed. No pertinent surgical history. Social History:  reports that he has never smoked. He has never used smokeless tobacco. He reports that he drinks alcohol. He reports that he does not use drugs. Patient lives at home  No Known Allergies  No family history on file.  Hypertension  Prior to Admission medications   Medication Sig Start Date End Date Taking? Authorizing Provider  ALPRAZolam (XANAX) 0.25 MG tablet Take 0.25 mg by mouth at bedtime  as needed.   Yes [provider]  amLODipine (NORVASC) 10 MG tablet Take 10 mg by mouth daily.   Yes [provider]  Ascorbic Acid (C-1000 SR) 1000 MG TBCR Take 1 tablet by mouth daily.   Yes [provider]  aspirin 81 MG tablet Take 81 mg by mouth daily.   Yes [provider]  cholecalciferol (VITAMIN D) 1000 UNITS tablet Take 1,000 Units by mouth daily.   Yes [provider]  ezetimibe (ZETIA) 10 MG tablet Take 10 mg by mouth daily.   Yes [provider]  hydrochlorothiazide (HYDRODIURIL) 25 MG tablet Take 25 mg by mouth daily.   Yes [provider]  ibandronate (BONIVA) 150 MG tablet Take 1 tablet by mouth every 30 (thirty) days. 03/27/18  Yes [provider]  Multiple Vitamin (MULTIVITAMIN WITH MINERALS) TABS Take 1 tablet by mouth daily.   Yes [provider]  quinapril (ACCUPRIL) 40 MG tablet Take 40 mg by mouth at bedtime.   Yes [provider]  rosuvastatin (CRESTOR) 5 MG tablet Take 5 mg by mouth daily.   Yes [provider]  tadalafil (CIALIS) 5 MG tablet Take 5 mg by mouth daily as needed.   Yes [provider]    Physical Exam: BP (!) 157/99   Pulse 86   Temp 97.7 F (36.5 C) (Oral)   Resp 20   Ht 5\' 8"  (1.727 m)   Wt 83.9 kg   SpO2 98%   BMI 28.13 kg/m   . General: Older Caucasian male who appears younger than stated age.  Awake and alert and oriented x3. No acute cardiopulmonary distress.  Marland Kitchen HEENT: Normocephalic atraumatic.  Right and left ears normal in appearance.  Pupils equal, round, reactive to light. Extraocular muscles are intact. Sclerae anicteric and noninjected.  Moist mucosal membranes. No mucosal lesions.  . Neck: Neck supple without lymphadenopathy. No carotid bruits. No masses palpated.  . Cardiovascular: Regular rate with normal S1-S2 sounds. No murmurs, rubs, gallops auscultated. No JVD.  Marland Kitchen Respiratory: Good respiratory effort with no wheezes, rales,  rhonchi. Lungs clear to auscultation bilaterally.  No accessory muscle use. . Abdomen: Soft, nontender, nondistended. Active bowel sounds. No masses or hepatosplenomegaly  . Skin: No rashes, lesions, or ulcerations.  Dry, warm to touch. 2+ dorsalis pedis and radial pulses. . Musculoskeletal: No calf or leg pain. All major joints not erythematous nontender.  No upper or lower joint deformation.  Good ROM.  No contractures  . Psychiatric: Intact judgment and insight. Pleasant and cooperative. . Neurologic: No focal neurological deficits. Strength is 5/5 and symmetric in upper and lower extremities.  Cranial nerves II through XII are grossly intact.           Labs on Admission: I have personally reviewed following labs and imaging studies  CBC: Recent Labs  Lab 04/27/18 1243  WBC 11.5*  NEUTROABS 5.8  HGB 15.9  HCT 48.8  MCV 93.3  PLT 291   Basic Metabolic Panel: Recent Labs  Lab 04/27/18 1243  NA 140  K 3.2*  CL 105  CO2 24  GLUCOSE 148*  BUN 16  CREATININE 1.31*  CALCIUM 9.5  MG 2.1   GFR: Estimated Creatinine Clearance: 48.2 mL/min (A) (by C-G formula based on SCr of 1.31 mg/dL (H)). Liver Function Tests: No results for input(s): AST, ALT, ALKPHOS, BILITOT, PROT, ALBUMIN in the last 168 hours. No results for input(s): LIPASE, AMYLASE in the last 168 hours. No results for input(s): AMMONIA in the last 168 hours. Coagulation Profile: No results for input(s): INR, PROTIME in the last 168 hours. Cardiac Enzymes: Recent Labs  Lab 04/27/18 1243  TROPONINI 0.06*   BNP (last 3 results) No results for input(s): PROBNP in the last 8760 hours. HbA1C: No results for input(s): HGBA1C in the last 72 hours. CBG: Recent Labs  Lab 04/27/18 1243  GLUCAP 146*   Lipid Profile: No results for input(s): CHOL, HDL, LDLCALC, TRIG, CHOLHDL, LDLDIRECT in the last 72 hours. Thyroid Function Tests: No results for input(s): TSH, T4TOTAL, FREET4, T3FREE, THYROIDAB in the last 72  hours. Anemia Panel: No results for input(s): VITAMINB12, FOLATE, FERRITIN, TIBC, IRON, RETICCTPCT in the last 72 hours. Urine analysis:    Component Value Date/Time   COLORURINE YELLOW 04/27/2018 1342   APPEARANCEUR CLEAR 04/27/2018 1342   LABSPEC 1.009 04/27/2018 1342   PHURINE 6.0 04/27/2018 1342   GLUCOSEU NEGATIVE 04/27/2018 1342   HGBUR NEGATIVE 04/27/2018 1342   BILIRUBINUR NEGATIVE 04/27/2018 1342   KETONESUR 5 (A) 04/27/2018 1342   PROTEINUR NEGATIVE 04/27/2018 1342   NITRITE NEGATIVE 04/27/2018 1342   LEUKOCYTESUR NEGATIVE 04/27/2018 1342   Sepsis Labs: @LABRCNTIP (procalcitonin:4,lacticidven:4) )No results found for this or any previous visit (from the past 240 hour(s)).   Radiological Exams on Admission: Dg Chest 2 View  Result Date: 04/27/2018 CLINICAL DATA:  Dizziness and irregular heartbeat for several weeks, history diabetes mellitus, hypertension EXAM: CHEST - 2 VIEW COMPARISON:  None FINDINGS: Normal heart size, mediastinal contours, and pulmonary vascularity. Atherosclerotic calcification aorta. Mild elevation of LEFT diaphragm. No acute infiltrate, pleural effusion or  pneumothorax. Diffuse osseous demineralization. IMPRESSION: No acute abnormalities. Electronically Signed   By: Ulyses Southward M.D.   On: 04/27/2018 14:01    EKG: Independently reviewed.  SVT  Assessment/Plan: Principal Problem:   SVT (supraventricular tachycardia) (HCC) Active Problems:   Benign hypertensive heart disease without heart failure   Elevated serum creatinine    This patient was discussed with the ED physician, including pertinent vitals, physical exam findings, labs, and imaging.  We also discussed care given by the ED provider.  1. SVT a. Observation overnight b. Telemetry monitoring c. We will start diltiazem and stop amlodipine d. Echocardiogram 2. Hypertension 3. Elevated serum creatinine a. Question acute versus chronic.  IV fluids overnight, then repeat creatinine in  the morning  DVT prophylaxis: Lovenox Consultants: None Code Status: Code Family Communication: Present during interview Disposition Plan: Patient should be able to return home tomorrow   Levie Heritage, DO [facility] Pager [phone]  If 7PM-7AM, please contact night-coverage [email or web address] Password [email or web address]

## 2018-04-27 NOTE — ED Triage Notes (Signed)
Dizziness and irregular heartbeat for several weeks.  Spoke to PCP regarding this and was told if it continued that he would need a monitor

## 2018-04-27 NOTE — ED Notes (Signed)
Patient transported to X-ray 

## 2018-04-28 ENCOUNTER — Observation Stay (HOSPITAL_BASED_OUTPATIENT_CLINIC_OR_DEPARTMENT_OTHER): Payer: Medicare PPO

## 2018-04-28 DIAGNOSIS — I471 Supraventricular tachycardia: Principal | ICD-10-CM

## 2018-04-28 LAB — ECHOCARDIOGRAM COMPLETE
HEIGHTINCHES: 68 in
WEIGHTICAEL: 2987.67 [oz_av]

## 2018-04-28 LAB — BASIC METABOLIC PANEL
Anion gap: 7 (ref 5–15)
BUN: 16 mg/dL (ref 8–23)
CALCIUM: 9.1 mg/dL (ref 8.9–10.3)
CO2: 27 mmol/L (ref 22–32)
Chloride: 108 mmol/L (ref 98–111)
Creatinine, Ser: 0.96 mg/dL (ref 0.61–1.24)
GFR calc non Af Amer: >60 mL/min (ref >60)
Glucose, Bld: 111 mg/dL — ABNORMAL HIGH (ref 70–99)
Potassium: 3.3 mmol/L — ABNORMAL LOW (ref 3.5–5.1)
Sodium: 142 mmol/L (ref 135–145)

## 2018-04-28 LAB — TROPONIN I: Troponin I: 0.14 ng/mL (ref <0.03)

## 2018-04-28 MED ORDER — POTASSIUM CHLORIDE CRYS ER 20 MEQ PO TBCR
40.0000 meq | EXTENDED_RELEASE_TABLET | Freq: Once | ORAL | Status: AC
  Administered 2018-04-28: 40 meq via ORAL
  Filled 2018-04-28: qty 2

## 2018-04-28 NOTE — Progress Notes (Signed)
PROGRESS NOTE    Blair HeysHugh W James Jacobs  ZOX:[id number] DOB: 21-Mar-1941 DOA: 04/27/2018 PCP: Rodrigo RanPerini, Mark, MD   Brief Narrative:  Per HPI from Dr. Adrian BlackwaterStinson: Georgiann MohsHugh W Toney ReilCobb Jacobs is a 77 y.o. male with a history of hypertension, hyper cholesterolemia, diabetes.  Patient has been having intermittent dizziness spells over the past 3 months.  Spells are becoming more frequent.  Today he had a couple of spells and decided to come in for evaluation.  No palliating or provoking factors.  He has noted that his blood pressure is usually in the 140 range systolically and tachycardia when he has these spells.  Denies chest pain, shortness of breath during the episodes.  Patient was admitted for symptomatic SVT and has been started on diltiazem with amlodipine discontinued.  2D echocardiogram pending.  Assessment & Plan:   Principal Problem:   SVT (supraventricular tachycardia) (HCC) Active Problems:   Benign hypertensive heart disease without heart failure   Elevated serum creatinine  1. Symptomatic SVT.  Patient is noted to have drop in blood pressures with elevated heart rates that have been ongoing now for several months.  He states that the episodes have been getting more prolonged and intensifying.  He is tried metoprolol in the past with no luck as he has had terrible side effects.  He appears to be tolerating Cardizem at the moment and will continue for the time being.  2D echocardiogram pending.  Will consult cardiology in a.m. for further recommendations prior to discharge. 2. Hypertension.  Continue to monitor while on Cardizem and off amlodipine. 3. AKI.  Resolved.  Continue to monitor with repeat labs in a.m. 4. Mild hypokalemia.  Likely secondary to HCTZ.  Will replete.  Magnesium noted to be within normal limits.  Continue on daily basis and recheck labs in a.m.    DVT prophylaxis: Lovenox Code Status: Full Family Communication: Wife and son at bedside Disposition Plan: Continue on Cardizem and  assess further control and check 2D echocardiogram.  Consult to cardiology in a.m. and patient may require outpatient Holter monitoring.   Consultants:   Cardiology in a.m.  Procedures:   None  Antimicrobials:   None   Subjective: Patient seen and evaluated today with no new acute complaints or concerns. No acute concerns or events noted overnight.  Objective: Vitals:   04/27/18 1638 04/27/18 1657 04/27/18 2202 04/28/18 0500  BP:  (!) 146/86 129/69 (!) 141/76  Pulse:  87 62 61  Resp:  18 18 18   Temp: 98.2 F (36.8 C) 97.6 F (36.4 C) 97.7 F (36.5 C) 98.2 F (36.8 C)  TempSrc: Oral Oral Oral Oral  SpO2:  98% 95% 96%  Weight:  84.7 kg    Height:  5\' 8"  (1.727 m)      Intake/Output Summary (Last 24 hours) at 04/28/2018 1054 Last data filed at 04/28/2018 0406 Gross per 24 hour  Intake 1821.15 ml  Output -  Net 1821.15 ml   Filed Weights   04/27/18 1225 04/27/18 1657  Weight: 83.9 kg 84.7 kg    Examination:  General exam: Appears calm and comfortable  Respiratory system: Clear to auscultation. Respiratory effort normal. Cardiovascular system: S1 & S2 heard, RRR. No JVD, murmurs, rubs, gallops or clicks. No pedal edema. Gastrointestinal system: Abdomen is nondistended, soft and nontender. No organomegaly or masses felt. Normal bowel sounds heard. Central nervous system: Alert and oriented. No focal neurological deficits. Extremities: Symmetric 5 x 5 power. Skin: No rashes, lesions or ulcers Psychiatry: Judgement  and insight appear normal. Mood & affect appropriate.     Data Reviewed: I have personally reviewed following labs and imaging studies  CBC: Recent Labs  Lab 04/27/18 1243  WBC 11.5*  NEUTROABS 5.8  HGB 15.9  HCT 48.8  MCV 93.3  PLT 291   Basic Metabolic Panel: Recent Labs  Lab 04/27/18 1243 04/28/18 0557  NA 140 142  K 3.2* 3.3*  CL 105 108  CO2 24 27  GLUCOSE 148* 111*  BUN 16 16  CREATININE 1.31* 0.96  CALCIUM 9.5 9.1  MG  2.1  --    GFR: Estimated Creatinine Clearance: 66.1 mL/min (by C-G formula based on SCr of 0.96 mg/dL). Liver Function Tests: No results for input(s): AST, ALT, ALKPHOS, BILITOT, PROT, ALBUMIN in the last 168 hours. No results for input(s): LIPASE, AMYLASE in the last 168 hours. No results for input(s): AMMONIA in the last 168 hours. Coagulation Profile: No results for input(s): INR, PROTIME in the last 168 hours. Cardiac Enzymes: Recent Labs  Lab 04/27/18 1243 04/27/18 1909 04/28/18 0557  TROPONINI 0.06* 0.15* 0.14*   BNP (last 3 results) No results for input(s): PROBNP in the last 8760 hours. HbA1C: No results for input(s): HGBA1C in the last 72 hours. CBG: Recent Labs  Lab 04/27/18 1243  GLUCAP 146*   Lipid Profile: No results for input(s): CHOL, HDL, LDLCALC, TRIG, CHOLHDL, LDLDIRECT in the last 72 hours. Thyroid Function Tests: No results for input(s): TSH, T4TOTAL, FREET4, T3FREE, THYROIDAB in the last 72 hours. Anemia Panel: No results for input(s): VITAMINB12, FOLATE, FERRITIN, TIBC, IRON, RETICCTPCT in the last 72 hours. Sepsis Labs: No results for input(s): PROCALCITON, LATICACIDVEN in the last 168 hours.  No results found for this or any previous visit (from the past 240 hour(s)).       Radiology Studies: Dg Chest 2 View  Result Date: 04/27/2018 CLINICAL DATA:  Dizziness and irregular heartbeat for several weeks, history diabetes mellitus, hypertension EXAM: CHEST - 2 VIEW COMPARISON:  None FINDINGS: Normal heart size, mediastinal contours, and pulmonary vascularity. Atherosclerotic calcification aorta. Mild elevation of LEFT diaphragm. No acute infiltrate, pleural effusion or pneumothorax. Diffuse osseous demineralization. IMPRESSION: No acute abnormalities. Electronically Signed   By: Ulyses Southward M.D.   On: 04/27/2018 14:01        Scheduled Meds: . aspirin EC  81 mg Oral Daily  . diltiazem  120 mg Oral Daily  . enoxaparin (LOVENOX) injection  40  mg Subcutaneous Q24H  . ezetimibe  10 mg Oral Daily  . hydrochlorothiazide  25 mg Oral Daily  . lisinopril  40 mg Oral Daily  . rosuvastatin  5 mg Oral Daily   Continuous Infusions: . sodium chloride 75 mL/hr at 04/28/18 0406     LOS: 0 days    Time spent: 30 minutes     Hoover Brunette, DO [facility] Pager [phone]  If 7PM-7AM, please contact night-coverage [email or web address] Password [facility] 04/28/2018, 10:54 AM

## 2018-04-28 NOTE — Progress Notes (Signed)
*  PRELIMINARY RESULTS* Echocardiogram 2D Echocardiogram has been performed.  Stacey DrainWhite, Kaytlan Behrman J 04/28/2018, 10:53 AM

## 2018-04-29 ENCOUNTER — Telehealth: Payer: Self-pay

## 2018-04-29 DIAGNOSIS — I471 Supraventricular tachycardia: Secondary | ICD-10-CM | POA: Diagnosis not present

## 2018-04-29 LAB — BASIC METABOLIC PANEL
Anion gap: 7 (ref 5–15)
BUN: 13 mg/dL (ref 8–23)
CHLORIDE: 109 mmol/L (ref 98–111)
CO2: 25 mmol/L (ref 22–32)
CREATININE: 0.85 mg/dL (ref 0.61–1.24)
Calcium: 9 mg/dL (ref 8.9–10.3)
GFR calc Af Amer: >60 mL/min (ref >60)
GFR calc non Af Amer: >60 mL/min (ref >60)
GLUCOSE: 115 mg/dL — AB (ref 70–99)
Potassium: 3.2 mmol/L — ABNORMAL LOW (ref 3.5–5.1)
Sodium: 141 mmol/L (ref 135–145)

## 2018-04-29 LAB — TSH: TSH: 5.121 u[IU]/mL — ABNORMAL HIGH (ref 0.350–4.500)

## 2018-04-29 MED ORDER — DILTIAZEM HCL ER COATED BEADS 120 MG PO CP24: 120.0000 mg | ORAL_CAPSULE | Freq: Every day | ORAL | 3 refills | Status: DC

## 2018-04-29 NOTE — Progress Notes (Signed)
Progress Note  Patient Name: James Jacobs Date of Encounter: 04/29/2018  Primary Cardiologist:  Wants to see Diona BrownerMcDowell  Subjective   No complaints Spent a lot of time discussing SVT with son [place]  Inpatient Medications    Scheduled Meds: . aspirin EC  81 mg Oral Daily  . diltiazem  120 mg Oral Daily  . enoxaparin (LOVENOX) injection  40 mg Subcutaneous Q24H  . ezetimibe  10 mg Oral Daily  . hydrochlorothiazide  25 mg Oral Daily  . lisinopril  40 mg Oral Daily  . rosuvastatin  5 mg Oral Daily   Continuous Infusions: . sodium chloride 75 mL/hr at 04/29/18 0211   PRN Meds: acetaminophen **OR** acetaminophen, ALPRAZolam, ondansetron **OR** ondansetron (ZOFRAN) IV, senna-docusate   Vital Signs    Vitals:   04/28/18 0500 04/28/18 1424 04/28/18 2219 04/29/18 0629  BP: (!) 141/76 (!) 141/69 (!) 143/79 (!) 152/72  Pulse: 61 64 (!) 55 61  Resp: 18 19 18 18   Temp: 98.2 F (36.8 C) 98.2 F (36.8 C) 97.6 F (36.4 C) 98.6 F (37 C)  TempSrc: Oral Oral Oral Oral  SpO2: 96% 97% 96% 95%  Weight:      Height:        Intake/Output Summary (Last 24 hours) at 04/29/2018 0828 Last data filed at 04/29/2018 [id number] Gross per 24 hour  Intake 3006.41 ml  Output -  Net 3006.41 ml   Filed Weights   04/27/18 1225 04/27/18 1657  Weight: 83.9 kg 84.7 kg    Telemetry    NSR  - Personally Reviewed  ECG    NSR no pre excitation on admission SVT  - Personally Reviewed  Physical Exam   GEN: No acute distress.   Neck: No JVD Cardiac: RRR, no murmurs, rubs, or gallops.  Respiratory: Clear to auscultation bilaterally. GI: Soft, nontender, non-distended  MS: No edema; No deformity. Neuro:  Nonfocal  Psych: Normal affect   Labs    Chemistry Recent Labs  Lab 04/27/18 1243 04/28/18 0557 04/29/18 0550  NA 140 142 141  K 3.2* 3.3* 3.2*  CL 105 108 109  CO2 24 27 25   GLUCOSE 148* 111* 115*  BUN 16 16 13   CREATININE 1.31* 0.96 0.85    CALCIUM 9.5 9.1 9.0  GFRNONAA 50* >60 >60  GFRAA 58* >60 >60  ANIONGAP 11 7 7      Hematology Recent Labs  Lab 04/27/18 1243  WBC 11.5*  RBC 5.23  HGB 15.9  HCT 48.8  MCV 93.3  MCH 30.4  MCHC 32.6  RDW 13.2  PLT 291    Cardiac Enzymes Recent Labs  Lab 04/27/18 1243 04/27/18 1909 04/28/18 0557  TROPONINI 0.06* 0.15* 0.14*   No results for input(s): TROPIPOC in the last 168 hours.   BNP Recent Labs  Lab 04/27/18 1243  BNP 144.0*     DDimer No results for input(s): DDIMER in the last 168 hours.   Radiology    Dg Chest 2 View  Result Date: 04/27/2018 CLINICAL DATA:  Dizziness and irregular heartbeat for several weeks, history diabetes mellitus, hypertension EXAM: CHEST - 2 VIEW COMPARISON:  None FINDINGS: Normal heart size, mediastinal contours, and pulmonary vascularity. Atherosclerotic calcification aorta. Mild elevation of LEFT diaphragm. No acute infiltrate, pleural effusion or pneumothorax. Diffuse osseous demineralization. IMPRESSION: No acute abnormalities. Electronically Signed   By: Ulyses SouthwardMark  Boles M.D.   On: 04/27/2018 14:01    Cardiac Studies   TTE:  Normal EF 55-60% no valve disease  Patient Profile     77 y.o. male with dizziness related to muscle relaxant taken for back pain. Caused low BP and ? SVT. Medication stopped and amlodipine changed to cardizem   Assessment & Plan    SVT:  Related to hypotension and taking a muscle relaxant. TSH ordered this am. TTE normal on cardizem Stable no recurrence on telemetry  Elevated troponin: ? Related to rapid rate no chest pain TTE normal can arrange outpatient stress test  Back Pain: f/u Perrini avoid muscle relaxant  HLD:  Continue crestor and zetia labs with primary  OK to d/c home today    [facility] will sign off.   Medication Recommendations:  Cardizem Other recommendations (labs, testing, etc):  TSH ordered today  Follow up as an outpatient:  F/U with Dr Diona Browner or Tressie Stalker with PA  outpatient stress test Can arrange event monitor for any recurrence of symptoms   For questions or updates, please contact [facility] Please consult [email or web address] for contact info under        Signed, Charlton Haws, MD  04/29/2018, 8:28 AM

## 2018-04-29 NOTE — Telephone Encounter (Addendum)
Patient contacted regarding [facility] on 04[facility]/18.  Patient understands to follow up with provider Kil[place] PA[facility]C [facility]. Patient understands discharge instructions? yes Patient understands medications and regiment? yes Patient understands to bring all medications to this visit? yes     Pt will review d/c instructions again and cal me for any questions

## 2018-04-29 NOTE — Progress Notes (Signed)
Patient is to be discharged home and in stable condition. Patient's IV and telemetry removed, WNL. Patient given discharge instructions and verbalized understanding. Patient to be escorted out by staff via wheelchair upon wife's arrival.  Quita SkyeMorgan P Dishmon, RN

## 2018-04-29 NOTE — Discharge Summary (Signed)
Physician Discharge Summary  Blair HeysHugh W Cobb Jr WUJ:[id number] DOB: 1940/06/18 DOA: 04/27/2018  PCP: Rodrigo RanPerini, Mark, MD  Admit date: 04/27/2018  Discharge date: 04/29/2018  Admitted From:Home  Disposition:  Home  Recommendations for Outpatient Follow-up:  1. Follow up with PCP in 1-2 weeks 2. Continue on Cardizem for heart rate control as prescribed and discontinue amlodipine 3. Follow-up thyroid levels with PCP in the future 4. Follow-up with Dr. Ladona Ridgelaylor with EP as noted by cardiology  Home Health: None  Equipment/Devices: None  Discharge Condition: Stable  CODE STATUS: Full  Diet recommendation: Heart Healthy/carb modified  Brief/Interim Summary: Per HPI from Dr. Adrian BlackwaterStinson: James RobinsonHugh W Cobb Jris a 77 y.o.malewith a history of hypertension, hyper cholesterolemia, diabetes. Patient has been having intermittent dizziness spells over the past 3 months. Spells are becoming more frequent. Today he had a couple of spells and decided to come in for evaluation. No palliating or provoking factors. He has noted that his blood pressure is usually in the 140 range systolically and tachycardia when he has these spells.Denies chest pain, shortness of breath during the episodes.  Patient was admitted for symptomatic SVT and has been started on diltiazem with amlodipine discontinued.  2D echocardiogram with LVEF 55 to 60% and no valvular abnormalities and grade 1 diastolic dysfunction.  He has had stable heart rate control noted and TSH was mildly elevated at 5.1 which will need further follow-up in the outpatient setting.  Will refrain from initiation of Synthroid during the inpatient stay due to arrhythmia.  Patient is otherwise asymptomatic.  No other acute events noted during the course of this admission.  He has been seen by cardiology and will follow up with EP in the next 2 weeks as an outpatient.  Continue other home medications as previously prescribed.  Discharge Diagnoses:  Principal  Problem:   SVT (supraventricular tachycardia) (HCC) Active Problems:   Benign hypertensive heart disease without heart failure   Elevated serum creatinine  Principal discharge diagnosis: Symptomatic SVT.  Discharge Instructions  Discharge Instructions    Diet - low sodium heart healthy   Complete by:  As directed    Increase activity slowly   Complete by:  As directed      Allergies as of 04/29/2018   No Known Allergies     Medication List    STOP taking these medications   amLODipine 10 MG tablet Commonly known as:  NORVASC     TAKE these medications   ALPRAZolam 0.25 MG tablet Commonly known as:  XANAX Take 0.25 mg by mouth at bedtime as needed.   aspirin 81 MG tablet Take 81 mg by mouth daily.   C-1000 SR 1000 MG Tbcr Generic drug:  Ascorbic Acid Take 1 tablet by mouth daily.   cholecalciferol 1000 units tablet Commonly known as:  VITAMIN D Take 1,000 Units by mouth daily.   diltiazem 120 MG 24 hr capsule Commonly known as:  CARDIZEM CD Take 1 capsule (120 mg total) by mouth daily. Start taking on:  04/30/2018   ezetimibe 10 MG tablet Commonly known as:  ZETIA Take 10 mg by mouth daily.   hydrochlorothiazide 25 MG tablet Commonly known as:  HYDRODIURIL Take 25 mg by mouth daily.   ibandronate 150 MG tablet Commonly known as:  BONIVA Take 1 tablet by mouth every 30 (thirty) days.   multivitamin with minerals Tabs tablet Take 1 tablet by mouth daily.   quinapril 40 MG tablet Commonly known as:  ACCUPRIL Take 40 mg by mouth  at bedtime.   rosuvastatin 5 MG tablet Commonly known as:  CRESTOR Take 5 mg by mouth daily.   tadalafil 5 MG tablet Commonly known as:  CIALIS Take 5 mg by mouth daily as needed.      Follow-up Information    Rodrigo Ran, MD Follow up in 1 week(s).   Specialty:  Internal Medicine Contact information: [address] [place] [address] [phone]          No Known  Allergies  Consultations:  Cardiology   Procedures/Studies: Dg Chest 2 View  Result Date: 04/27/2018 CLINICAL DATA:  Dizziness and irregular heartbeat for several weeks, history diabetes mellitus, hypertension EXAM: CHEST - 2 VIEW COMPARISON:  None FINDINGS: Normal heart size, mediastinal contours, and pulmonary vascularity. Atherosclerotic calcification aorta. Mild elevation of LEFT diaphragm. No acute infiltrate, pleural effusion or pneumothorax. Diffuse osseous demineralization. IMPRESSION: No acute abnormalities. Electronically Signed   By: Ulyses Southward M.D.   On: 04/27/2018 14:01   Discharge Exam: Vitals:   04/29/18 0629 04/29/18 0839  BP: (!) 152/72 (!) 164/79  Pulse: 61 67  Resp: 18   Temp: 98.6 F (37 C)   SpO2: 95%    Vitals:   04/28/18 1424 04/28/18 2219 04/29/18 0629 04/29/18 0839  BP: (!) 141/69 (!) 143/79 (!) 152/72 (!) 164/79  Pulse: 64 (!) 55 61 67  Resp: 19 18 18    Temp: 98.2 F (36.8 C) 97.6 F (36.4 C) 98.6 F (37 C)   TempSrc: Oral Oral Oral   SpO2: 97% 96% 95%   Weight:      Height:        General: Pt is alert, awake, not in acute distress Cardiovascular: RRR, S1/S2 +, no rubs, no gallops Respiratory: CTA bilaterally, no wheezing, no rhonchi Abdominal: Soft, NT, ND, bowel sounds + Extremities: no edema, no cyanosis    The results of significant diagnostics from this hospitalization (including imaging, microbiology, ancillary and laboratory) are listed below for reference.     Microbiology: No results found for this or any previous visit (from the past 240 hour(s)).   Labs: BNP (last 3 results) Recent Labs    04/27/18 1243  BNP 144.0*   Basic Metabolic Panel: Recent Labs  Lab 04/27/18 1243 04/28/18 0557 04/29/18 0550  NA 140 142 141  K 3.2* 3.3* 3.2*  CL 105 108 109  CO2 24 27 25   GLUCOSE 148* 111* 115*  BUN 16 16 13   CREATININE 1.31* 0.96 0.85  CALCIUM 9.5 9.1 9.0  MG 2.1  --   --    Liver Function Tests: No results for  input(s): AST, ALT, ALKPHOS, BILITOT, PROT, ALBUMIN in the last 168 hours. No results for input(s): LIPASE, AMYLASE in the last 168 hours. No results for input(s): AMMONIA in the last 168 hours. CBC: Recent Labs  Lab 04/27/18 1243  WBC 11.5*  NEUTROABS 5.8  HGB 15.9  HCT 48.8  MCV 93.3  PLT 291   Cardiac Enzymes: Recent Labs  Lab 04/27/18 1243 04/27/18 1909 04/28/18 0557  TROPONINI 0.06* 0.15* 0.14*   BNP: Invalid input(s): POCBNP CBG: Recent Labs  Lab 04/27/18 1243  GLUCAP 146*   D-Dimer No results for input(s): DDIMER in the last 72 hours. Hgb A1c No results for input(s): HGBA1C in the last 72 hours. Lipid Profile No results for input(s): CHOL, HDL, LDLCALC, TRIG, CHOLHDL, LDLDIRECT in the last 72 hours. Thyroid function studies Recent Labs    04/27/18 1240  TSH 5.121*   Anemia work up  No results for input(s): VITAMINB12, FOLATE, FERRITIN, TIBC, IRON, RETICCTPCT in the last 72 hours. Urinalysis    Component Value Date/Time   COLORURINE YELLOW 04/27/2018 1342   APPEARANCEUR CLEAR 04/27/2018 1342   LABSPEC 1.009 04/27/2018 1342   PHURINE 6.0 04/27/2018 1342   GLUCOSEU NEGATIVE 04/27/2018 1342   HGBUR NEGATIVE 04/27/2018 1342   BILIRUBINUR NEGATIVE 04/27/2018 1342   KETONESUR 5 (A) 04/27/2018 1342   PROTEINUR NEGATIVE 04/27/2018 1342   NITRITE NEGATIVE 04/27/2018 1342   LEUKOCYTESUR NEGATIVE 04/27/2018 1342   Sepsis Labs Invalid input(s): PROCALCITONIN,  WBC,  LACTICIDVEN Microbiology No results found for this or any previous visit (from the past 240 hour(s)).   Time coordinating discharge: 35 minutes  SIGNED:   Erick Blinks, DO [facility] 04/29/2018, 10:58 AM Pager [phone]  If 7PM-7AM, please contact night-coverage [email or web address] Password [email or web address]

## 2018-04-29 NOTE — Telephone Encounter (Signed)
-----   Message from Dyane Dustmanerry L Goins sent at 04/29/2018 10:28 AM EST ----- Regarding: TOC Needs TOC 2 weeks and f/u with Dr Diona BrownerMcDowell or Ladona Ridgelaylor for SVT  His father went to school with Sam;s dad. Outpatient exercise myovue can be arranged prior to f/u If he has more dizziness or palpitations can have event monitor   ##TOC appt scheduled w/ Franky MachoLuke for 12/10. I don't know what to do about the rest.  Thanks, Aurther Lofterry

## 2018-05-08 ENCOUNTER — Other Ambulatory Visit: Payer: Self-pay | Admitting: Internal Medicine

## 2018-05-08 DIAGNOSIS — G8929 Other chronic pain: Secondary | ICD-10-CM

## 2018-05-08 DIAGNOSIS — M545 Low back pain, unspecified: Secondary | ICD-10-CM

## 2018-05-13 NOTE — Progress Notes (Signed)
Cardiology Office Note   Date:  05/14/2018   ID:  James Jacobs, DOB Jan 25, 1941, MRN [id number]  PCP:  James Jacobs, Mark, MD  Cardiologist:  Dr. Diona Jacobs   Chief Complaint  Patient presents with  . Tachycardia    PSVT  . Hyperlipidemia   History of Present Illness: James Jacobs is a 77 y.o. male who presents for ongoing assessment and management of of PSVT. He was last seen during inpatient evaluation by Dr. Eden Jacobs on 04/29/2018. It was felt his SVT was related to hypotension from taking muscle relaxant. No further SVT on telemetry. He was to have outpatient stress test. Other history includes hypercholesterolemia, and chronic back pain.  He has not had any further episodes of SVT since being seen in [facility]. He states that he feels well. Has followed up with James Jacobs office post hospital follow up.   Past Medical History:  Diagnosis Date  . Diabetes mellitus without complication (HCC)   . History of nuclear stress test 2013   low risk study  . Hypercholesteremia   . Hypertension     No past surgical history on file.   Current Outpatient Medications  Medication Sig Dispense Refill  . ALPRAZolam (XANAX) 0.25 MG tablet Take 0.25 mg by mouth at bedtime as needed.    . Ascorbic Acid (C-1000 SR) 1000 MG TBCR Take 1 tablet by mouth daily.    Marland Kitchen. aspirin 81 MG tablet Take 81 mg by mouth daily.    . cholecalciferol (VITAMIN D) 1000 UNITS tablet Take 1,000 Units by mouth daily.    Marland Kitchen. diltiazem (CARDIZEM CD) 120 MG 24 hr capsule Take 1 capsule (120 mg total) by mouth daily. 30 capsule 3  . ezetimibe (ZETIA) 10 MG tablet Take 10 mg by mouth daily.    . hydrochlorothiazide (HYDRODIURIL) 25 MG tablet Take 25 mg by mouth daily.    Marland Kitchen. ibandronate (BONIVA) 150 MG tablet Take 1 tablet by mouth every 30 (thirty) days.  3  . Multiple Vitamin (MULTIVITAMIN WITH MINERALS) TABS Take 1 tablet by mouth daily.    . quinapril (ACCUPRIL) 40 MG tablet Take 40 mg by mouth at bedtime.    . rosuvastatin  (CRESTOR) 5 MG tablet Take 5 mg by mouth daily.    . tadalafil (CIALIS) 5 MG tablet Take 5 mg by mouth daily as needed.     No current facility-administered medications for this visit.     Allergies:   Patient has no known allergies.    Social History:  The patient  reports that he has never smoked. He has never used smokeless tobacco. He reports that he drinks alcohol. He reports that he does not use drugs.   Family History:  The patient's family history is not on file.    ROS: All other systems are reviewed and negative. Unless otherwise mentioned in H&P    PHYSICAL EXAM: VS:  There were no vitals taken for this visit. , BMI There is no height or weight on file to calculate BMI. GEN: Well nourished, well developed, in no acute distress HEENT: normal Neck: no JVD, carotid bruits, or masses Cardiac: RRR; no murmurs, rubs, or gallops,no edema  Respiratory:  Clear to auscultation bilaterally, normal work of breathing GI: soft, nontender, nondistended, + BS MS: no deformity or atrophy Skin: warm and dry, no rash Neuro:  Strength and sensation are intact Psych: euthymic mood, full affect   EKG:  Not completed this office visit.   Recent Labs: 04/27/2018:  B Natriuretic Peptide 144.0; Hemoglobin 15.9; Magnesium 2.1; Platelets 291; TSH 5.121 04/29/2018: BUN 13; Creatinine, Ser 0.85; Potassium 3.2; Sodium 141    Lipid Panel No results found for: CHOL, TRIG, HDL, CHOLHDL, VLDL, LDLCALC, LDLDIRECT    Wt Readings from Last 3 Encounters:  04/27/18 186 lb 11.7 oz (84.7 kg)  06/04/12 207 lb (93.9 kg)  05/01/12 204 lb (92.5 kg)      Other studies Reviewed: Echocardiogram 05-16-18 Left ventricle: The cavity size was normal. Wall thickness was   normal. Systolic function was normal. The estimated ejection   fraction was in the range of 55% to 60%. Wall motion was normal;  there were no regional wall motion abnormalities. Doppler parameters are consistent with abnormal left  ventricular   relaxation (grade 1 diastolic dysfunction).  Impressions:  - Normal LV systolic function; mild diastolic dysfunction.  ASSESSMENT AND PLAN:  1.  PSVT: He is on both amlodipine and diltiazem. He states since starting the diltiazem he has not had any recurrence of rapid HR.  Since he is on both a non-dihydropyridine and dihydropyridine CCB, I will take him off amlodipine. I will increase diltiazem to 180 mg daily.   As recommended by Dr. Eden Emms during inpatient evaluation, he will be scheduled for NM stress test to assess for ischemia causing his arrhythmia with CVRF of age, HTN, HL.  He will have this scheduled at [facility] as he lives in [place], and wants to follow up there.   2. Hypertension: His BP was elevated in initial evaluation on arrival to his appointment. I have rechecked it in the hospital, BP is now 134/68. Will follow BP as I have adjusted his medications as above.   3. Hypercholesterolemia: On both ezetimibe and rosuvastatin. Followed by PCP.   Current medicines are reviewed at length with the patient today.    Labs/ tests ordered today include: NM stress test. JamesNishan indicated in his note that the patient wanted to be followed by Dr. Diona Browner in [place].   Bettey Mare. Liborio Nixon, ANP, AACC   05/14/2018 1:48 PM    [facility] [address] Office [phone] Fax [phone]

## 2018-05-14 ENCOUNTER — Ambulatory Visit: Payer: Medicare PPO | Admitting: Adult Health

## 2018-05-14 ENCOUNTER — Encounter: Payer: Self-pay | Admitting: Adult Health

## 2018-05-14 ENCOUNTER — Ambulatory Visit: Payer: Medicare PPO | Admitting: Cardiology

## 2018-05-14 VITALS — BP 152/78 | HR 82 | Ht 68.0 in | Wt 193.2 lb

## 2018-05-14 DIAGNOSIS — I471 Supraventricular tachycardia: Secondary | ICD-10-CM

## 2018-05-14 DIAGNOSIS — I10 Essential (primary) hypertension: Secondary | ICD-10-CM | POA: Diagnosis not present

## 2018-05-14 DIAGNOSIS — E7800 Pure hypercholesterolemia, unspecified: Secondary | ICD-10-CM

## 2018-05-14 MED ORDER — DILTIAZEM HCL ER COATED BEADS 180 MG PO CP24: 180.0000 mg | ORAL_CAPSULE | Freq: Every day | ORAL | 6 refills | Status: DC

## 2018-05-14 MED ORDER — AMLODIPINE BESYLATE 5 MG PO TABS: 5.0000 mg | ORAL_TABLET | Freq: Every day | ORAL | 3 refills | Status: DC

## 2018-05-14 NOTE — Addendum Note (Signed)
Addended by: Alyson InglesBROOME, MICHELLE L on: 05/14/2018 02:43 PM   Modules accepted: Orders

## 2018-05-14 NOTE — Patient Instructions (Signed)
Medication Instructions:  If you need a refill on your cardiac medications before your next appointment, please call your pharmacy.  Labwork: If you have labs (blood work) drawn today and your tests are completely normal, you will receive your results only by: MyChart Message (if you have MyChart) OR A paper copy in the mail.  If you have any lab test that is abnormal or we need to change your treatment, we will call you to review the results.  Testing/Procedures: Your physician has requested that you have an Exercise Myoview. A cardiac stress test is a cardiological test that measures the heart's ability to respond to external stress in a controlled clinical environment. The stress response is induced by exercise (exercise-treadmill). For further information please visit [email or web address]. If you have questions or concerns about your appointment, you can call the Nuclear Lab at [phone].   Follow-Up: You will need a follow up appointment in AFTER TESTING.  You may see  DR MCDOWELL ONLY,  Joni ReiningKathryn Lawrence, DNP, AACC or one of the following Advanced Practice Providers on your designated Care Team. At [facility], you and your health needs are our priority.  As part of our continuing mission to provide you with exceptional heart care, we have created designated Provider Care Teams.  These Care Teams include your primary Cardiologist (physician) and Advanced Practice Providers (APPs -  Physician Assistants and Nurse Practitioners) who all work together to provide you with the care you need, when you need it.

## 2018-05-15 ENCOUNTER — Ambulatory Visit
Admission: RE | Admit: 2018-05-15 | Discharge: 2018-05-15 | Disposition: A | Discharge status: Home / Self Care | Payer: Medicare PPO | Source: Ambulatory Visit | Attending: Internal Medicine | Admitting: Internal Medicine

## 2018-05-15 DIAGNOSIS — M545 Low back pain, unspecified: Secondary | ICD-10-CM

## 2018-05-15 DIAGNOSIS — G8929 Other chronic pain: Secondary | ICD-10-CM

## 2018-05-15 MED ORDER — IOPAMIDOL (ISOVUE-M 200) INJECTION 41%
1.0000 mL | Freq: Once | INTRAMUSCULAR | Status: AC
  Administered 2018-05-15: 1 mL via EPIDURAL

## 2018-05-15 MED ORDER — METHYLPREDNISOLONE ACETATE 40 MG/ML INJ SUSP (RADIOLOG
120.0000 mg | Freq: Once | INTRAMUSCULAR | Status: AC
  Administered 2018-05-15: 120 mg via EPIDURAL

## 2018-05-15 NOTE — Discharge Instructions (Signed)

## 2018-05-16 ENCOUNTER — Other Ambulatory Visit: Payer: Medicare PPO

## 2018-05-17 ENCOUNTER — Encounter (HOSPITAL_COMMUNITY): Payer: Medicare PPO

## 2018-05-21 ENCOUNTER — Encounter: Payer: Self-pay | Admitting: Internal Medicine

## 2018-05-21 ENCOUNTER — Ambulatory Visit: Payer: Medicare PPO | Admitting: Internal Medicine

## 2018-05-21 ENCOUNTER — Ambulatory Visit: Payer: Medicare PPO | Admitting: Cardiovascular Disease

## 2018-05-21 VITALS — BP 168/80 | HR 79 | Ht 68.0 in | Wt 197.0 lb

## 2018-05-21 DIAGNOSIS — I471 Supraventricular tachycardia: Secondary | ICD-10-CM

## 2018-05-21 NOTE — Patient Instructions (Addendum)
Medication Instructions:  Your physician recommends that you continue on your current medications as directed. Please refer to the Current Medication list given to you today.   Labwork: None ordered   Testing/Procedures: None ordered   Follow-Up: Your physician wants you to follow-up in: As needed with Dr. Taylor.   Any Other Special Instructions Will Be Listed Below (If Applicable).    If you need a refill on your cardiac medications before your next appointment, please call your pharmacy.   

## 2018-05-21 NOTE — Progress Notes (Signed)
HPI James Jacobs is referred today by Bailey MechKatherine Lawrence for evaluation of SVT. He is a pleasant 77 yo man with HTN. He has developed tachypalpitations and he was switched to Cardizem from amlodipine when he was found to have SVT. He has not had recurrent SVT since starting the cardizem 180 mg daily. He is pending workup for CAD and is pending a visit with Dr. Allyson SabalBerry. When he goes into SVT he feels some dyspnea but no syncope and denies chest pain. He feels like his SVT is related to back pain. In the ED he was treated with IV adenosine with relief of symptoms.  No Known Allergies   Current Outpatient Medications  Medication Sig Dispense Refill  . ALPRAZolam (XANAX) 0.25 MG tablet Take 0.25 mg by mouth at bedtime as needed.    . Ascorbic Acid (C-1000 SR) 1000 MG TBCR Take 1 tablet by mouth daily.    Marland Kitchen. aspirin 81 MG tablet Take 81 mg by mouth daily.    . cholecalciferol (VITAMIN D) 1000 UNITS tablet Take 1,000 Units by mouth daily.    Marland Kitchen. diltiazem (CARDIZEM CD) 180 MG 24 hr capsule Take 1 capsule (180 mg total) by mouth daily. 30 capsule 6  . ezetimibe (ZETIA) 10 MG tablet Take 10 mg by mouth daily.    . hydrochlorothiazide (HYDRODIURIL) 25 MG tablet Take 25 mg by mouth daily.    Marland Kitchen. ibandronate (BONIVA) 150 MG tablet Take 1 tablet by mouth every 30 (thirty) days.  3  . Multiple Vitamin (MULTIVITAMIN WITH MINERALS) TABS Take 1 tablet by mouth daily.    . quinapril (ACCUPRIL) 40 MG tablet Take 40 mg by mouth at bedtime.    . rosuvastatin (CRESTOR) 5 MG tablet Take 5 mg by mouth daily.    . tadalafil (CIALIS) 5 MG tablet Take 5 mg by mouth daily as needed.     No current facility-administered medications for this visit.      Past Medical History:  Diagnosis Date  . Diabetes mellitus without complication (HCC)   . History of nuclear stress test 2013   low risk study  . Hypercholesteremia   . Hypertension     ROS:   All systems reviewed and negative except as noted in the  HPI.   History reviewed. No pertinent surgical history.   History reviewed. No pertinent family history.   Social History   Socioeconomic History  . Marital status: Married    Spouse name: Not on file  . Number of children: Not on file  . Years of education: Not on file  . Highest education level: Not on file  Occupational History  . Not on file  Social Needs  . Financial resource strain: Not on file  . Food insecurity:    Worry: Not on file    Inability: Not on file  . Transportation needs:    Medical: Not on file    Non-medical: Not on file  Tobacco Use  . Smoking status: Never Smoker  . Smokeless tobacco: Never Used  Substance and Sexual Activity  . Alcohol use: Yes  . Drug use: Never  . Sexual activity: Yes  Lifestyle  . Physical activity:    Days per week: Not on file    Minutes per session: Not on file  . Stress: Not on file  Relationships  . Social connections:    Talks on phone: Not on file    Gets together: Not on file    Attends religious  service: Not on file    Active member of club or organization: Not on file    Attends meetings of clubs or organizations: Not on file    Relationship status: Not on file  . Intimate partner violence:    Fear of current or ex partner: Not on file    Emotionally abused: Not on file    Physically abused: Not on file    Forced sexual activity: Not on file  Other Topics Concern  . Not on file  Social History Narrative  . Not on file     BP (!) 168/80   Pulse 79   Ht 5\' 8"  (1.727 m)   Wt 197 lb (89.4 kg)   SpO2 99%   BMI 29.95 kg/m   Physical Exam:  Well appearing 77 yo man, NAD HEENT: Unremarkable Neck:  No JVD, no thyromegally Lymphatics:  No adenopathy Back:  No CVA tenderness Lungs:  Clear with no wheezes HEART:  Regular rate rhythm, no murmurs, no rubs, no clicks Abd:  soft, positive bowel sounds, no organomegally, no rebound, no guarding Ext:  2 plus pulses, no edema, no cyanosis, no  clubbing Skin:  No rashes no nodules Neuro:  CN II through XII intact, motor grossly intact  EKG- nsr with no pre-excitation. PVC's is present  Assess/Plan: 1. SVT - his symptoms appear to be well controlled on Cardizem. I discussed the treatment options and recommended watchful waiting. If he has recurrent SVT on cardizem then I would recommend catheter ablation. For now I have encouraged him to avoid ETOH and caffeine. 2. HTN - he has switched from amlodipine to cardizem. His pressure is high today but he is anxious about his multiple MD visits.    Leonia Reeves.D.

## 2018-05-22 ENCOUNTER — Encounter (HOSPITAL_COMMUNITY): Payer: Self-pay

## 2018-05-22 ENCOUNTER — Ambulatory Visit (HOSPITAL_COMMUNITY)
Admission: RE | Admit: 2018-05-22 | Discharge: 2018-05-22 | Disposition: A | Discharge status: Home / Self Care | Payer: Medicare PPO | Source: Ambulatory Visit | Attending: Cardiology | Admitting: Cardiology

## 2018-05-22 ENCOUNTER — Encounter (HOSPITAL_BASED_OUTPATIENT_CLINIC_OR_DEPARTMENT_OTHER)
Admission: RE | Admit: 2018-05-22 | Discharge: 2018-05-22 | Disposition: A | Discharge status: Home / Self Care | Payer: Medicare PPO | Source: Ambulatory Visit | Attending: Cardiology | Admitting: Cardiology

## 2018-05-22 DIAGNOSIS — I471 Supraventricular tachycardia: Secondary | ICD-10-CM | POA: Diagnosis present

## 2018-05-22 DIAGNOSIS — I10 Essential (primary) hypertension: Secondary | ICD-10-CM | POA: Diagnosis not present

## 2018-05-22 LAB — NM MYOCAR MULTI W/SPECT W/WALL MOTION / EF
Estimated workload: 8.6 METS
Exercise duration (min): 7 min
Exercise duration (sec): 30 s
LV dias vol: 106 mL (ref 62–150)
LV sys vol: 45 mL
MPHR: 141 {beats}/min
Peak HR: 122 {beats}/min
Percent HR: 86 %
RATE: 0.4
RPE: 13
Rest HR: 58 {beats}/min
SDS: 0
SRS: 2
SSS: 2
TID: 1.12

## 2018-05-22 MED ORDER — SODIUM CHLORIDE 0.9% FLUSH
INTRAVENOUS | Status: AC
  Administered 2018-05-22: 10 mL via INTRAVENOUS
  Filled 2018-05-22: qty 10

## 2018-05-22 MED ORDER — REGADENOSON 0.4 MG/5ML IV SOLN
INTRAVENOUS | Status: AC
  Filled 2018-05-22: qty 5

## 2018-05-22 MED ORDER — TECHNETIUM TC 99M TETROFOSMIN IV KIT
30.0000 | PACK | Freq: Once | INTRAVENOUS | Status: AC | PRN
  Administered 2018-05-22: 30.1 via INTRAVENOUS

## 2018-05-22 MED ORDER — TECHNETIUM TC 99M TETROFOSMIN IV KIT
10.0000 | PACK | Freq: Once | INTRAVENOUS | Status: AC | PRN
  Administered 2018-05-22: 9 via INTRAVENOUS

## 2018-05-24 ENCOUNTER — Ambulatory Visit: Payer: Medicare PPO | Admitting: Cardiovascular Disease

## 2018-05-24 ENCOUNTER — Encounter: Payer: Self-pay | Admitting: Cardiovascular Disease

## 2018-05-24 DIAGNOSIS — I471 Supraventricular tachycardia: Secondary | ICD-10-CM | POA: Diagnosis not present

## 2018-05-24 DIAGNOSIS — I10 Essential (primary) hypertension: Secondary | ICD-10-CM | POA: Diagnosis not present

## 2018-05-24 DIAGNOSIS — E7800 Pure hypercholesterolemia, unspecified: Secondary | ICD-10-CM

## 2018-05-24 NOTE — Progress Notes (Signed)
05/24/2018 James Jacobs   Oct 18, 1940  [id number]  Primary Physician James Jacobs, Mark, MD Primary Cardiologist: Runell GessJonathan J Charne Mcbrien MD Milagros LollFACP, FACC, [place], [place]  HPI:  James Jacobs is a 77 y.o. mildly overweight married Caucasian male father of 2, grandfather 2 grandchildren is accompanied by his wife James Jacobs today.  His primary care provider is Dr. Waynard EdwardsPerini.  He was referred from the hospital for post hospital follow-up.  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  His risk factors include hypertension and hyperlipidemia.  He is never had a heart attack or stroke.  He denies chest pain or shortness of breath.  He was recently admitted to the hospital for 2 days from 04/27/2018 with symptomatic SVT.  Converted to sinus rhythm.  2D echo was normal.  Subsequent Myoview stress test was normal as well.  He said no recurrent symptoms.  [place] otherwise lives in [place].   Current Meds  Medication Sig  . ALPRAZolam (XANAX) 0.25 MG tablet Take 0.25 mg by mouth at bedtime as needed.  . Ascorbic Acid (C-1000 SR) 1000 MG TBCR Take 1 tablet by mouth daily.  Marland Kitchen. aspirin 81 MG tablet Take 81 mg by mouth daily.  . cholecalciferol (VITAMIN D) 1000 UNITS tablet Take 1,000 Units by mouth daily.  Marland Kitchen. diltiazem (CARDIZEM CD) 180 MG 24 hr capsule Take 1 capsule (180 mg total) by mouth daily.  Marland Kitchen. ezetimibe (ZETIA) 10 MG tablet Take 10 mg by mouth daily.  . hydrochlorothiazide (HYDRODIURIL) 25 MG tablet Take 25 mg by mouth daily.  Marland Kitchen. ibandronate (BONIVA) 150 MG tablet Take 1 tablet by mouth every 30 (thirty) days.  . Multiple Vitamin (MULTIVITAMIN WITH MINERALS) TABS Take 1 tablet by mouth daily.  . quinapril (ACCUPRIL) 40 MG tablet Take 40 mg by mouth at bedtime.  . rosuvastatin (CRESTOR) 5 MG tablet Take 5 mg by mouth daily.  . tadalafil (CIALIS) 5 MG tablet Take 5 mg by mouth daily as needed.     No Known Allergies  Social History   Socioeconomic History   . Marital status: Married    Spouse name: Not on file  . Number of children: Not on file  . Years of education: Not on file  . Highest education level: Not on file  Occupational History  . Not on file  Social Needs  . Financial resource strain: Not on file  . Food insecurity:    Worry: Not on file    Inability: Not on file  . Transportation needs:    Medical: Not on file    Non-medical: Not on file  Tobacco Use  . Smoking status: Never Smoker  . Smokeless tobacco: Never Used  Substance and Sexual Activity  . Alcohol use: Yes  . Drug use: Never  . Sexual activity: Yes  Lifestyle  . Physical activity:    Days per week: Not on file    Minutes per session: Not on file  . Stress: Not on file  Relationships  . Social connections:    Talks on phone: Not on file    Gets together: Not on file    Attends religious service: Not on file    Active member of club or organization: Not on file    Attends meetings of clubs or organizations: Not on file    Relationship status: Not on file  . Intimate partner violence:    Fear  of current or ex partner: Not on file    Emotionally abused: Not on file    Physically abused: Not on file    Forced sexual activity: Not on file  Other Topics Concern  . Not on file  Social History Narrative  . Not on file     Review of Systems: General: negative for chills, fever, night sweats or weight changes.  Cardiovascular: negative for chest pain, dyspnea on exertion, edema, orthopnea, palpitations, paroxysmal nocturnal dyspnea or shortness of breath Dermatological: negative for rash Respiratory: negative for cough or wheezing Urologic: negative for hematuria Abdominal: negative for nausea, vomiting, diarrhea, bright red blood per rectum, melena, or hematemesis Neurologic: negative for visual changes, syncope, or dizziness All other systems reviewed and are otherwise negative except as noted above.    Blood pressure (!) 172/90, pulse 73, height  5' 8.5" (1.74 m), weight 191 lb 3.2 oz (86.7 kg), SpO2 94 %.  General appearance: alert and no distress Neck: no adenopathy, no carotid bruit, no JVD, supple, symmetrical, trachea midline and thyroid not enlarged, symmetric, no tenderness/mass/nodules Lungs: clear to auscultation bilaterally Heart: regular rate and rhythm, S1, S2 normal, no murmur, click, rub or gallop Extremities: extremities normal, atraumatic, no cyanosis or edema Pulses: 2+ and symmetric Skin: Skin color, texture, turgor normal. No rashes or lesions Neurologic: Alert and oriented X 3, normal strength and tone. Normal symmetric reflexes. Normal coordination and gait  EKG not performed today  ASSESSMENT AND PLAN:   Pure hypercholesterolemia History of hyperlipidemia on Zetia and Crestor with lipid profile performed 03/19/2018 revealing total cholesterol 169, LDL of 105 and HDL 48.  Essential hypertension History of essential potential blood pressure measured today at 168/80.  He was recently switched from amlodipine to Cardizem because of elevated heart rate.  Is also on hydrochlorothiazide quinapril.  His blood pressures otherwise are fairly well controlled especially in the afternoons after taking his blood pressure medicines.  SVT (supraventricular tachycardia) [facility](HCC) Mr Allison QuarryCobb was found to be in SVT during a recent hospital admission when he was admitted with probably a drug reaction from muscle relaxant.  He quickly reverted to sinus rhythm.  At that time, his amlodipine was switched to Cardizem.      Runell GessJonathan J. Delenn Ahn MD FACP,FACC,FAHA, [facility] 05/24/2018 1:42 PM

## 2018-05-24 NOTE — Assessment & Plan Note (Signed)
History of hyperlipidemia on Zetia and Crestor with lipid profile performed 03/19/2018 revealing total cholesterol 169, LDL of 105 and HDL 48.

## 2018-05-24 NOTE — Assessment & Plan Note (Signed)
Mr Allison QuarryCobb was found to be in SVT during a recent hospital admission when he was admitted with probably a drug reaction from muscle relaxant.  He quickly reverted to sinus rhythm.  At that time, his amlodipine was switched to Cardizem.

## 2018-05-24 NOTE — Assessment & Plan Note (Signed)
History of essential potential blood pressure measured today at 168/80.  He was recently switched from amlodipine to Cardizem because of elevated heart rate.  Is also on hydrochlorothiazide quinapril.  His blood pressures otherwise are fairly well controlled especially in the afternoons after taking his blood pressure medicines.

## 2018-05-24 NOTE — Patient Instructions (Signed)
Medication Instructions:  NO CHANGE If you need a refill on your cardiac medications before your next appointment, please call your pharmacy.   Lab work: If you have labs (blood work) drawn today and your tests are completely normal, you will receive your results only by: . MyChart Message (if you have MyChart) OR . A paper copy in the mail If you have any lab test that is abnormal or we need to change your treatment, we will call you to review the results.  Follow-Up: At [facility], you and your health needs are our priority.  As part of our continuing mission to provide you with exceptional heart care, we have created designated Provider Care Teams.  These Care Teams include your primary Cardiologist (physician) and Advanced Practice Providers (APPs -  Physician Assistants and Nurse Practitioners) who all work together to provide you with the care you need, when you need it. You will need a follow up appointment in 12 months.  Please call our office 2 months in advance to schedule this appointment.  You may see JONATHAN BERRY MD or one of the following Advanced Practice Providers on your designated Care Team:   Luke Kilroy, PA-C Krista Kroeger, PA-C . Callie Goodrich, PA-C     

## 2018-06-03 ENCOUNTER — Other Ambulatory Visit: Payer: Self-pay | Admitting: Internal Medicine

## 2018-06-03 DIAGNOSIS — G8929 Other chronic pain: Secondary | ICD-10-CM

## 2018-06-03 DIAGNOSIS — M545 Low back pain, unspecified: Secondary | ICD-10-CM

## 2018-06-12 ENCOUNTER — Ambulatory Visit
Admission: RE | Admit: 2018-06-12 | Discharge: 2018-06-12 | Disposition: A | Discharge status: Home / Self Care | Payer: Medicare PPO | Source: Ambulatory Visit | Attending: Internal Medicine | Admitting: Internal Medicine

## 2018-06-12 DIAGNOSIS — G8929 Other chronic pain: Secondary | ICD-10-CM

## 2018-06-12 DIAGNOSIS — M545 Low back pain, unspecified: Secondary | ICD-10-CM

## 2018-06-12 MED ORDER — METHYLPREDNISOLONE ACETATE 40 MG/ML INJ SUSP (RADIOLOG
120.0000 mg | Freq: Once | INTRAMUSCULAR | Status: AC
  Administered 2018-06-12: 120 mg via EPIDURAL

## 2018-06-12 MED ORDER — IOPAMIDOL (ISOVUE-M 200) INJECTION 41%
1.0000 mL | Freq: Once | INTRAMUSCULAR | Status: AC
  Administered 2018-06-12: 1 mL via EPIDURAL

## 2018-06-12 NOTE — Discharge Instructions (Signed)

## 2018-08-15 ENCOUNTER — Telehealth: Payer: Self-pay | Admitting: Cardiovascular Disease

## 2018-08-15 MED ORDER — DILTIAZEM HCL ER COATED BEADS 240 MG PO CP24: 240.0000 mg | ORAL_CAPSULE | Freq: Every day | ORAL | 6 refills | Status: DC

## 2018-08-15 NOTE — Telephone Encounter (Signed)
Spoke with pt, this morning while bending over to pick up a box his heart rate elevated as listed. He sat down and rested and after 10 min his pulse lowered and is currently 70's. He has been busy this morning packing to go to [place]. Val-salva maneuvers discussed with the patient. He reports this is the first time he has had SVT since changing to diltiazem. He also reports his bp is running 140-150/79-80 when it was lower on the norvasc. He did not have any symptoms with the SVT and feels fine now. He thinks his bp needs better control. Will forward to dr berry to review and advise

## 2018-08-15 NOTE — Telephone Encounter (Signed)
Spoke with pt and pt made aware of Dr. Hazle Coca recommendation that he can increase Cardizem from 180 to 240 and keep a 30-day BP log and come in to be seen by pharmD in 4 weeks. Pt okay to increase cardizem to 240mg  daily and f/u appt with pharmD scheduled. Pt agreeable with this

## 2018-08-15 NOTE — Telephone Encounter (Signed)
Can increase Cardizem from 180 to 240. Have him keep a 30  Day BP log and come in to be seen by Baxter Hire in 4 weeks

## 2018-08-15 NOTE — Telephone Encounter (Signed)
  Patient calling c/o heart racing this morning. He does state that he was very active during the episode. He says that since he has been taken off of Norvasc that his BP runs a little high. BP this morning 115/93 Pulse 146 at 7:30am. At 7:54 187/55 Pulse was 84. Now it is 145/87 Pulse 77. He is concerned about his blood pressure running so high.

## 2018-09-20 ENCOUNTER — Telehealth: Payer: Self-pay | Admitting: Pharmacist Clinician (PhC)/ Clinical Pharmacy Specialist

## 2018-09-20 NOTE — Telephone Encounter (Signed)
Spoke with patient about upcoming appt and need to cancel.  Patient is currently in [place], unable to get back because of travel restrictions.  He is doing well, but has some complaints about his current medications.  He notes that since being hospitalized, he feels much more fatigued, and can easily nap whenever he sits still for any length of time.  States he was not like this before then, he usually is very active.  Would like to get back to his normal activity level.    Scheduled time for phone conference next Tuesday, will share this with Raquel Rodriguez-Guzman, as she will most likely be speaking with him

## 2018-09-24 ENCOUNTER — Telehealth: Payer: Self-pay | Admitting: Pharmacist

## 2018-09-24 ENCOUNTER — Ambulatory Visit: Payer: Medicare PPO

## 2018-09-24 MED ORDER — DILTIAZEM HCL ER COATED BEADS 240 MG PO CP24: 240.0000 mg | ORAL_CAPSULE | Freq: Every day | ORAL | 6 refills | Status: DC

## 2018-09-24 NOTE — Telephone Encounter (Signed)
**  Phone follow-up**  78yo male with prior medical history significant for hypertension, hyperlipidemia, and symptomatic SVT.  Patient report compliance but feeling very tired. Reports lack of energy and increased fatigue since taking diltiazem.  Current HTN meds:  Diltiazem CD  240mg  daily HCTZ 25mg  daily Quinapril 40mg  daily  BP goal: 130/80  Diet: all home cooked meals, low sodium and low sugar.  Exercise: walking 1 miles per day (used to walked 5 miles daily prior to last hospital admission)  Home BP readings:  140/81 (HR 66); 143/70 (70); 147/83 (65); 146/79 (63); 143/71 (63); 140/68(63)  Average Home BP 143/75 (HR 63-70)  Current Outpatient Medications on File Prior to Visit  Medication Sig Dispense Refill  . ALPRAZolam (XANAX) 0.25 MG tablet Take 0.25 mg by mouth at bedtime as needed.    . Ascorbic Acid (C-1000 SR) 1000 MG TBCR Take 1 tablet by mouth daily.    Marland Kitchen aspirin 81 MG tablet Take 81 mg by mouth daily.    . cholecalciferol (VITAMIN D) 1000 UNITS tablet Take 1,000 Units by mouth daily.    Marland Kitchen diltiazem (CARDIZEM CD) 240 MG 24 hr capsule Take 1 capsule (240 mg total) by mouth daily for 30 days. 30 capsule 6  . ezetimibe (ZETIA) 10 MG tablet Take 10 mg by mouth daily.    . hydrochlorothiazide (HYDRODIURIL) 25 MG tablet Take 25 mg by mouth daily.    Marland Kitchen ibandronate (BONIVA) 150 MG tablet Take 1 tablet by mouth every 30 (thirty) days.  3  . Multiple Vitamin (MULTIVITAMIN WITH MINERALS) TABS Take 1 tablet by mouth daily.    . quinapril (ACCUPRIL) 40 MG tablet Take 40 mg by mouth at bedtime.    . rosuvastatin (CRESTOR) 5 MG tablet Take 5 mg by mouth daily.    . tadalafil (CIALIS) 5 MG tablet Take 5 mg by mouth daily as needed.     No current facility-administered medications on file prior to visit.     Assessment and Plan:  Blood pressure remains slightly above goal, but noted reduction from last OV reading. Patient continues to experienced increased fatigue and SVT  well controlled.  Will change diltiazem 240mg  to evening administration and split quinapril to 20mg  BID instead of 40mg  every morning.  If fatigue improved by next f/u, I plan to change HCTZ to chlorthalidone 25mg  for better BP control. Otherwise, will consider change diltiazem to carvedilol 25mg  BID.  Naia Ruff Rodriguez-Guzman PharmD, BCPS, CPP [facility] [id number] [place] [id number] 09/24/2018 11:43 AM

## 2018-11-12 ENCOUNTER — Telehealth: Payer: Self-pay | Admitting: Cardiovascular Disease

## 2018-11-12 DIAGNOSIS — I10 Essential (primary) hypertension: Secondary | ICD-10-CM

## 2018-11-12 MED ORDER — SPIRONOLACTONE 25 MG PO TABS: 25.0000 mg | ORAL_TABLET | Freq: Every day | ORAL | 5 refills | Status: AC

## 2018-11-12 MED ORDER — DILTIAZEM HCL ER COATED BEADS 180 MG PO CP24: 180.0000 mg | ORAL_CAPSULE | Freq: Every day | ORAL | 5 refills | Status: AC

## 2018-11-12 NOTE — Telephone Encounter (Signed)
Called patient- he states that his Blood pressure is still not coming down- he states even though the increase in Cardizem. He states he has no dizziness, sob, or chest pain; but has not energy at all- I advised I would route a message to pharmD since they have been assisting him.  He has a 3 month record of his bp that shows no changes.

## 2018-11-12 NOTE — Telephone Encounter (Signed)
Pt c/o BP issue: STAT if pt c/o blurred vision, one-sided weakness or slurred speech  1. What are your last 5 BP readings?  144/82 132/68 152/77 146/77 158/77 143/77 137/77  2. Are you having any other symptoms (ex. Dizziness, headache, blurred vision, passed out)? Fatigue. He seems all he wants to do is sleep.   3. What is your BP issue? He states his BP is usually 130/70, and he feels his BP is running high. He is wondering if the carvedilol is causing his bp to act up.

## 2018-11-12 NOTE — Telephone Encounter (Signed)
Returned call to patient, spent 20 minutes in conversation regarding his BP readings.   He emailed a list of averages for the past 90 days while we spoke.    He has concerns that his BP is still essentially unchanged and he has experienced ongoing fatigue that started after his dose of diltiazem was increased to 180 mg then 240 mg.  He was previously on amlodipine, but started on diltiazem after hospitalization in Nov 2019 for palpitations.  Other medications include hctz 25 mg (he takes this only q2-3 days due to increased urination) and quinapril 40 mg qd.    After discussing his readings and reviewing options, will have patient cut diltiazem back to 180 mg, stop hctz and start spironolactone 25 mg qd.  He admits that the frequent urination may be more of a hereditary issue than the med, but hopefully spironolactone will cause him less problems, if it is indeed the medication.  He will come to the office in 10 days for BMET after starting.    He will also send in most recent BP readings in 2 weeks.

## 2018-11-27 LAB — BASIC METABOLIC PANEL
BUN/Creatinine Ratio: 18 (ref 10–24)
BUN: 19 mg/dL (ref 8–27)
CO2: 22 mmol/L (ref 20–29)
Calcium: 10.7 mg/dL — ABNORMAL HIGH (ref 8.6–10.2)
Chloride: 105 mmol/L (ref 96–106)
Creatinine, Ser: 1.08 mg/dL (ref 0.76–1.27)
GFR calc Af Amer: 75 mL/min/{1.73_m2} (ref >59)
GFR calc non Af Amer: 64 mL/min/{1.73_m2} (ref >59)
Glucose: 105 mg/dL — ABNORMAL HIGH (ref 65–99)
Potassium: 4.6 mmol/L (ref 3.5–5.2)
Sodium: 142 mmol/L (ref 134–144)

## 2018-12-05 ENCOUNTER — Telehealth: Payer: Self-pay | Admitting: Cardiovascular Disease

## 2018-12-05 NOTE — Telephone Encounter (Signed)
What do you think??

## 2018-12-05 NOTE — Telephone Encounter (Signed)
New Message    Pt c/o BP issue:  1. What are your last 5 BP readings? 76/54, 148/64, 145/80 2. Are you having any other symptoms (ex. Dizziness, headache, blurred vision, passed out)? Dizziness, sweaty and a little unsteady 3. What is your medication issue?  Patient states that he spoke with Cyril Mourning and she changed his medication. But he is still have some issues with his BP. Please call to discuss.

## 2018-12-16 NOTE — Telephone Encounter (Signed)
No VM, phone connected to loud static then went dead

## 2018-12-18 NOTE — Telephone Encounter (Signed)
Spoke with patient, he has been having SVT.  Reviewed with Kerin Ransom PA, who suggested patient be put on his schedule.  Returned call to patient, he is already half way to [place] (has a house in [facility] and one in [place]).  States he will discuss with MD in [place] should it continue to occur

## 2018-12-27 ENCOUNTER — Other Ambulatory Visit: Payer: Self-pay | Admitting: Pharmacist Clinician (PhC)/ Clinical Pharmacy Specialist

## 2018-12-27 MED ORDER — METOPROLOL TARTRATE 25 MG PO TABS: 12.5000 mg | ORAL_TABLET | Freq: Two times a day (BID) | ORAL | 3 refills | Status: DC

## 2018-12-27 NOTE — Telephone Encounter (Signed)
Called patient, he sent weekly BP readings.  All looking good.  He notes another episode of SVT today with BP down to 82/62 and HR to 145.    Will have him cut spironolactone dose to 12.5 mg qd (he states no longer taking hctz) and add metoprolol 12.5 mg bid.  Advised that he call once back in [place] to get appointment in office.

## 2019-02-07 ENCOUNTER — Other Ambulatory Visit: Payer: Self-pay | Admitting: Neurosurgery

## 2019-02-11 ENCOUNTER — Other Ambulatory Visit: Payer: Self-pay | Admitting: Neurosurgery

## 2019-02-11 ENCOUNTER — Other Ambulatory Visit (HOSPITAL_COMMUNITY)
Admission: RE | Admit: 2019-02-11 | Discharge: 2019-02-11 | Disposition: A | Discharge status: Home / Self Care | Payer: Medicare PPO | Source: Ambulatory Visit | Attending: Neurosurgery | Admitting: Neurosurgery

## 2019-02-11 DIAGNOSIS — Z20828 Contact with and (suspected) exposure to other viral communicable diseases: Secondary | ICD-10-CM | POA: Diagnosis not present

## 2019-02-11 DIAGNOSIS — Z01812 Encounter for preprocedural laboratory examination: Secondary | ICD-10-CM | POA: Diagnosis present

## 2019-02-11 LAB — SARS CORONAVIRUS 2 (TAT 6-24 HRS): SARS Coronavirus 2: NEGATIVE

## 2019-02-12 ENCOUNTER — Other Ambulatory Visit: Payer: Self-pay

## 2019-02-12 ENCOUNTER — Encounter (HOSPITAL_COMMUNITY): Payer: Self-pay | Admitting: *Deleted

## 2019-02-12 NOTE — Progress Notes (Signed)
Anesthesia Chart Review: James Jacobs    Case: [id number] Date/Time: 02/13/19 0715   Procedure: RIGHT LUMBAR 4  HEMILAMINOTOMY AND MICRODISCECTOMY (Right ) - RIGHT LUMBAR 4  HEMILAMINOTOMY AND MICRODISCECTOMY   Anesthesia type: General   Pre-op diagnosis: HERNIATED NUCLEUS PULPOSUS, LUMBAR   Location: [facility] OR ROOM 20 / [facility]   Surgeon: Jovita Gamma, MD      DISCUSSION: Patient is an 78 year old male scheduled for the above procedure.  History includes never smoker, HTN, hypercholesterolemia, pre-diabetes.  - Admission 04/27/18-04/29/18 for recurrent dizziness. Noted to have intermittent SVT. Treated with IV metoprolol with good response. Mildly elevated troponin, possibly related to tachycardia. Seen by cardiology and started on diltiazem. Echo showed normal LVEF. Out-patient stress test was low risk.  Last seen by cardiologist Dr. Gwenlyn Found on 05/24/18. Last seen by EP cardiologist Dr. Lovena Le 05/21/18. No recurrent SVT at that time and BP felt fairly well controlled. May consider ablation if recurrent SVT. Since then he likely had recurrent SVT on 08/15/18--patient reported heart racing with BP 115/93 and pulse 146 (in setting of activity, packing for [place]) with HR down to 84 24 minutes later. Dr. Gwenlyn Found notified and increased Cardizem from 180 to 240 mg with 30 day BP log and [facility] PharmD follow-up. According to 12/18/18 note and 12/27/18 notes, he reported episodes of SVT again with hypotension during event (BP 82/62). Patient was out of town at the time. (He was headed to [place], but visited son in [place] first where his wife fell, so they never actually made it fo [place] before heading back to [facility]). Following his 2 SVT episodes in July, PharmD readjusted medications, decreasing Cardizem back to 180 mg, decreasing spironolactone and adding metoprolol 12.5 mg BID. Since those adjustments, patient told me that he has not had any recurrent SVT and BP has stabilized (running ~ 130/70's). He  denied chest pain or SOB. He has not been able to be active due to back pain.   No known recurrent SVT following medication adjustment ~ 6 weeks ago and reports good HTN at home. He had a low risk stress test within the past year and echo showing normal LVEF. Based on known information, I would anticipate that he can proceed as planned if no acute changes; however, he is a same day work-up, so definitive plan on the day of surgery following anesthesia team evaluation and review of same day labs.   02/11/2019 COVID-19 test negative.   VS: Day of surgery   PROVIDERS: Crist Infante, MD is PCP Quay Burow, MD is cardiologist  Cristopher Peru, MD is EP cardiologist. Seen on 05/21/18 for SVT, improved on diltiazem. If recurrence could consider catheter ablation. Avoid ETOH and caffeine recommended.   LABS: He is for updated labs on the day of surgery. As of 11/26/18, Cr 1.08, glucose 105.    IMAGES: CXR 04/27/18: FINDINGS: Normal heart size, mediastinal contours, and pulmonary vascularity. Atherosclerotic calcification aorta. Mild elevation of LEFT diaphragm. No acute infiltrate, pleural effusion or pneumothorax. Diffuse osseous demineralization. IMPRESSION: No acute abnormalities.   EKG: 05/21/18 ([facility]): Sinus rhythm with first-degree AV block.  Occasional PVCs and PACs.   CV: Nuclear stress test 05/22/18: Study Result  Blood pressure demonstrated a hypertensive response to exercise.  Mild ST segment slurring (<23mm) in inferolateral leads during stage 3, returning to baseline within 1 minute of recovery.  Defect 1: There is a small defect of mild severity present in the mid inferior, mid inferolateral and apical inferior location. This may  represent a mild degree of myocardial scar, but soft tissue attenuation cannot entirely be ruled out. There are no ischemic territories.  This is a low risk study.  Nuclear stress EF: 57%.  Echo 04/28/18: Study Conclusions -  Left ventricle: The cavity size was normal. Wall thickness was   normal. Systolic function was normal. The estimated ejection   fraction was in the range of 55% to 60%. Wall motion was normal;   there were no regional wall motion abnormalities. Doppler   parameters are consistent with abnormal left ventricular   relaxation (grade 1 diastolic dysfunction). Impressions: - Normal LV systolic function; mild diastolic dysfunction.   Past Medical History:  Diagnosis Date  . Diabetes mellitus without complication (HCC)   . History of nuclear stress test 2013   low risk study  . Hypercholesteremia   . Hypertension     No past surgical history on file.  MEDICATIONS: No current facility-administered medications for this encounter.    Marland Kitchen. acetaminophen (TYLENOL) 500 MG tablet  . ALPRAZolam (XANAX) 0.25 MG tablet  . aspirin 81 MG tablet  . cholecalciferol (VITAMIN D) 1000 UNITS tablet  . diclofenac (VOLTAREN) 75 MG EC tablet  . diltiazem (CARDIZEM CD) 180 MG 24 hr capsule  . ezetimibe (ZETIA) 10 MG tablet  . Histamine Dihydrochloride (AUSTRALIAN DREAM ARTHRITIS EX)  . metoprolol tartrate (LOPRESSOR) 25 MG tablet  . OVER THE COUNTER MEDICATION  . quinapril (ACCUPRIL) 40 MG tablet  . rosuvastatin (CRESTOR) 20 MG tablet  . spironolactone (ALDACTONE) 25 MG tablet  . Multiple Vitamin (MULTIVITAMIN WITH MINERALS) TABS  . tadalafil (CIALIS) 20 MG tablet   Reports ASA on hold for ~ 2 weeks.   Shonna ChockAllison Rifka Ramey, PA-C Surgical Short Stay/Anesthesiology [facility] Phone [phone] [facility] Phone [phone] 02/12/2019 3:07 PM

## 2019-02-12 NOTE — Progress Notes (Signed)
Pt denies SOB and chest pain. Pt stated that he is under the care of Dr. Gwenlyn Found, Cardiology and Dr. Joylene Draft, PCP. Pt denies having a cardiac cath. Pt denies recent labs. Pt stated that he stopped taking Aspirin  "2 weeks go." Pt made aware to stop taking vitamins, fish oil, Curamin and herbal medications. Do not take any NSAIDs ie: Ibuprofen, Advil, Naproxen (Aleve), Motrin,  Diclofenac (Voltaren),  BC and Goody Powder. Pt denies having diabetes stating " I have pre-diabetes and I don't take any medication."  Pt stated that he does not check his CBG. Pt verbalized understanding of all pre-op instructions.  PA, Anesthesiology, asked to review pt cardiac history.

## 2019-02-12 NOTE — Anesthesia Preprocedure Evaluation (Addendum)
Anesthesia Evaluation  Patient identified by MRN, date of birth, ID band Patient awake    Reviewed: Allergy & Precautions, NPO status , Patient's Chart, lab work & pertinent test results  Airway Mallampati: III  TM Distance: >3 FB Neck ROM: Full    Dental no notable dental hx.    Pulmonary neg pulmonary ROS,    Pulmonary exam normal        Cardiovascular hypertension, Pt. on home beta blockers and Pt. on medications Normal cardiovascular exam  ECG: SR, rate 79  ECHO: Left ventricle: The cavity size was normal. Wall thickness was normal. Systolic function was normal. The estimated ejection fraction was in the range of 55% to 60%. Wall motion was normal; there were no regional wall motion abnormalities. Doppler parameters are consistent with abnormal left ventricular relaxation (grade 1 diastolic dysfunction).  Sees cardiologist Gwenlyn Found)  Episode of SVT (supraventricular tachycardia)   Neuro/Psych Anxiety negative neurological ROS     GI/Hepatic negative GI ROS, Neg liver ROS,   Endo/Other  diabetes  Renal/GU negative Renal ROS     Musculoskeletal negative musculoskeletal ROS (+)   Abdominal   Peds  Hematology negative hematology ROS (+)   Anesthesia Other Findings HERNIATED NUCLEUS PULPOSUS, LUMBAR  Reproductive/Obstetrics                           Anesthesia Physical Anesthesia Plan  ASA: III  Anesthesia Plan: General   Post-op Pain Management:    Induction: Intravenous  PONV Risk Score and Plan: 3 and Ondansetron, Dexamethasone and Treatment may vary due to age or medical condition  Airway Management Planned: Oral ETT  Additional Equipment:   Intra-op Plan:   Post-operative Plan: Extubation in OR  Informed Consent: I have reviewed the patients History and Physical, chart, labs and discussed the procedure including the risks, benefits and alternatives for the proposed  anesthesia with the patient or authorized representative who has indicated his/her understanding and acceptance.     Dental advisory given  Plan Discussed with: CRNA  Anesthesia Plan Comments: (Reviewed PAT note written 02/12/2019 by Myra Gianotti, PA-C. SAME DAY WORK-UP   )     Anesthesia Quick Evaluation

## 2019-02-13 ENCOUNTER — Observation Stay (HOSPITAL_COMMUNITY)
Admission: RE | Admit: 2019-02-13 | Discharge: 2019-02-13 | Disposition: A | Discharge status: Home / Self Care | Payer: Medicare PPO | Attending: Neurosurgery | Admitting: Neurosurgery

## 2019-02-13 ENCOUNTER — Ambulatory Visit (HOSPITAL_COMMUNITY): Payer: Medicare PPO | Admitting: Vascular Surgery

## 2019-02-13 ENCOUNTER — Encounter (HOSPITAL_COMMUNITY)
Admission: RE | Disposition: A | Discharge status: Home / Self Care | Payer: Self-pay | Source: Home / Self Care | Attending: Neurosurgery

## 2019-02-13 ENCOUNTER — Ambulatory Visit (HOSPITAL_COMMUNITY): Payer: Medicare PPO

## 2019-02-13 ENCOUNTER — Encounter (HOSPITAL_COMMUNITY): Payer: Self-pay

## 2019-02-13 DIAGNOSIS — M4726 Other spondylosis with radiculopathy, lumbar region: Secondary | ICD-10-CM | POA: Insufficient documentation

## 2019-02-13 DIAGNOSIS — Z79899 Other long term (current) drug therapy: Secondary | ICD-10-CM | POA: Diagnosis not present

## 2019-02-13 DIAGNOSIS — M5126 Other intervertebral disc displacement, lumbar region: Secondary | ICD-10-CM | POA: Diagnosis present

## 2019-02-13 DIAGNOSIS — Z7982 Long term (current) use of aspirin: Secondary | ICD-10-CM | POA: Insufficient documentation

## 2019-02-13 DIAGNOSIS — I10 Essential (primary) hypertension: Secondary | ICD-10-CM | POA: Diagnosis not present

## 2019-02-13 DIAGNOSIS — F419 Anxiety disorder, unspecified: Secondary | ICD-10-CM | POA: Diagnosis not present

## 2019-02-13 DIAGNOSIS — E7800 Pure hypercholesterolemia, unspecified: Secondary | ICD-10-CM | POA: Insufficient documentation

## 2019-02-13 DIAGNOSIS — M5116 Intervertebral disc disorders with radiculopathy, lumbar region: Secondary | ICD-10-CM | POA: Diagnosis present

## 2019-02-13 DIAGNOSIS — I471 Supraventricular tachycardia: Secondary | ICD-10-CM | POA: Diagnosis not present

## 2019-02-13 DIAGNOSIS — E119 Type 2 diabetes mellitus without complications: Secondary | ICD-10-CM | POA: Insufficient documentation

## 2019-02-13 DIAGNOSIS — Z419 Encounter for procedure for purposes other than remedying health state, unspecified: Secondary | ICD-10-CM

## 2019-02-13 HISTORY — DX: Unspecified cataract: H26.9

## 2019-02-13 HISTORY — DX: Other intervertebral disc displacement, lumbar region: M51.26

## 2019-02-13 HISTORY — PX: LUMBAR LAMINECTOMY/DECOMPRESSION MICRODISCECTOMY: SHX5026

## 2019-02-13 HISTORY — DX: Supraventricular tachycardia: I47.1

## 2019-02-13 HISTORY — DX: Presence of spectacles and contact lenses: Z97.3

## 2019-02-13 HISTORY — DX: Prediabetes: R73.03

## 2019-02-13 HISTORY — DX: Supraventricular tachycardia, unspecified: I47.10

## 2019-02-13 LAB — GLUCOSE, CAPILLARY
Glucose-Capillary: 106 mg/dL — ABNORMAL HIGH (ref 70–99)
Glucose-Capillary: 147 mg/dL — ABNORMAL HIGH (ref 70–99)

## 2019-02-13 LAB — BASIC METABOLIC PANEL
Anion gap: 10 (ref 5–15)
BUN: 20 mg/dL (ref 8–23)
CO2: 22 mmol/L (ref 22–32)
Calcium: 9.5 mg/dL (ref 8.9–10.3)
Chloride: 107 mmol/L (ref 98–111)
Creatinine, Ser: 0.98 mg/dL (ref 0.61–1.24)
GFR calc Af Amer: >60 mL/min (ref >60)
GFR calc non Af Amer: >60 mL/min (ref >60)
Glucose, Bld: 119 mg/dL — ABNORMAL HIGH (ref 70–99)
Potassium: 3.7 mmol/L (ref 3.5–5.1)
Sodium: 139 mmol/L (ref 135–145)

## 2019-02-13 LAB — CBC
HCT: 42.1 % (ref 39.0–52.0)
Hemoglobin: 13.9 g/dL (ref 13.0–17.0)
MCH: 31.2 pg (ref 26.0–34.0)
MCHC: 33 g/dL (ref 30.0–36.0)
MCV: 94.6 fL (ref 80.0–100.0)
Platelets: 247 10*3/uL (ref 150–400)
RBC: 4.45 MIL/uL (ref 4.22–5.81)
RDW: 13.4 % (ref 11.5–15.5)
WBC: 7 10*3/uL (ref 4.0–10.5)
nRBC: 0 % (ref 0.0–0.2)

## 2019-02-13 SURGERY — LUMBAR LAMINECTOMY/DECOMPRESSION MICRODISCECTOMY 1 LEVEL
Anesthesia: General | Laterality: Right

## 2019-02-13 MED ORDER — DEXAMETHASONE SODIUM PHOSPHATE 10 MG/ML IJ SOLN
INTRAMUSCULAR | Status: DC | PRN
  Administered 2019-02-13: 10 mg via INTRAVENOUS

## 2019-02-13 MED ORDER — ACETAMINOPHEN 650 MG RE SUPP: 650.0000 mg | RECTAL | Status: DC | PRN

## 2019-02-13 MED ORDER — CYCLOBENZAPRINE HCL 5 MG PO TABS: 5.0000 mg | ORAL_TABLET | Freq: Three times a day (TID) | ORAL | Status: DC | PRN

## 2019-02-13 MED ORDER — PHENOL 1.4 % MT LIQD: 1.0000 | OROMUCOSAL | Status: DC | PRN

## 2019-02-13 MED ORDER — MENTHOL 3 MG MT LOZG: 1.0000 | LOZENGE | OROMUCOSAL | Status: DC | PRN

## 2019-02-13 MED ORDER — ACETAMINOPHEN 325 MG PO TABS: 650.0000 mg | ORAL_TABLET | ORAL | Status: DC | PRN

## 2019-02-13 MED ORDER — MAGNESIUM HYDROXIDE 400 MG/5ML PO SUSP: 30.0000 mL | Freq: Every day | ORAL | Status: DC | PRN

## 2019-02-13 MED ORDER — FENTANYL CITRATE (PF) 100 MCG/2ML IJ SOLN
INTRAMUSCULAR | Status: AC
  Filled 2019-02-13: qty 2

## 2019-02-13 MED ORDER — THROMBIN 5000 UNITS EX SOLR
CUTANEOUS | Status: AC
  Filled 2019-02-13: qty 10000

## 2019-02-13 MED ORDER — CHLORHEXIDINE GLUCONATE CLOTH 2 % EX PADS: 6.0000 | MEDICATED_PAD | Freq: Once | CUTANEOUS | Status: DC

## 2019-02-13 MED ORDER — HYDROCODONE-ACETAMINOPHEN 5-325 MG PO TABS: 1.0000 | ORAL_TABLET | ORAL | 0 refills | Status: AC | PRN

## 2019-02-13 MED ORDER — FENTANYL CITRATE (PF) 250 MCG/5ML IJ SOLN
INTRAMUSCULAR | Status: AC
  Filled 2019-02-13: qty 5

## 2019-02-13 MED ORDER — LIDOCAINE-EPINEPHRINE 1 %-1:100000 IJ SOLN
INTRAMUSCULAR | Status: AC
  Filled 2019-02-13: qty 1

## 2019-02-13 MED ORDER — FENTANYL CITRATE (PF) 100 MCG/2ML IJ SOLN
INTRAMUSCULAR | Status: DC | PRN
  Administered 2019-02-13 (×5): 50 ug via INTRAVENOUS

## 2019-02-13 MED ORDER — 0.9 % SODIUM CHLORIDE (POUR BTL) OPTIME
TOPICAL | Status: DC | PRN
  Administered 2019-02-13: 1000 mL

## 2019-02-13 MED ORDER — THROMBIN 5000 UNITS EX SOLR
OROMUCOSAL | Status: DC | PRN
  Administered 2019-02-13: 5 mL via TOPICAL

## 2019-02-13 MED ORDER — SPIRONOLACTONE 12.5 MG HALF TABLET
12.5000 mg | ORAL_TABLET | Freq: Every day | ORAL | Status: DC
  Administered 2019-02-13: 12.5 mg via ORAL
  Filled 2019-02-13: qty 1

## 2019-02-13 MED ORDER — EPHEDRINE SULFATE 50 MG/ML IJ SOLN
INTRAMUSCULAR | Status: DC | PRN
  Administered 2019-02-13: 10 mg via INTRAVENOUS
  Administered 2019-02-13: 5 mg via INTRAVENOUS
  Administered 2019-02-13 (×2): 10 mg via INTRAVENOUS
  Administered 2019-02-13: 5 mg via INTRAVENOUS

## 2019-02-13 MED ORDER — DILTIAZEM HCL ER COATED BEADS 180 MG PO CP24
180.0000 mg | ORAL_CAPSULE | Freq: Every day | ORAL | Status: DC
  Administered 2019-02-13: 14:00:00 180 mg via ORAL
  Filled 2019-02-13: qty 1

## 2019-02-13 MED ORDER — VITAMIN D 25 MCG (1000 UNIT) PO TABS: 1000.0000 [IU] | ORAL_TABLET | Freq: Every day | ORAL | Status: DC

## 2019-02-13 MED ORDER — FENTANYL CITRATE (PF) 100 MCG/2ML IJ SOLN: 25.0000 ug | INTRAMUSCULAR | Status: DC | PRN

## 2019-02-13 MED ORDER — SODIUM CHLORIDE 0.9 % IV SOLN: 250.0000 mL | INTRAVENOUS | Status: DC

## 2019-02-13 MED ORDER — FENTANYL CITRATE (PF) 100 MCG/2ML IJ SOLN
INTRAMUSCULAR | Status: DC | PRN
  Administered 2019-02-13: 100 ug via INTRAVENOUS

## 2019-02-13 MED ORDER — LIDOCAINE-EPINEPHRINE 1 %-1:100000 IJ SOLN
INTRAMUSCULAR | Status: DC | PRN
  Administered 2019-02-13: 30 mL

## 2019-02-13 MED ORDER — KETOROLAC TROMETHAMINE 15 MG/ML IJ SOLN
INTRAMUSCULAR | Status: AC
  Filled 2019-02-13: qty 1

## 2019-02-13 MED ORDER — BUPIVACAINE HCL (PF) 0.5 % IJ SOLN
INTRAMUSCULAR | Status: DC | PRN
  Administered 2019-02-13: 30 mL

## 2019-02-13 MED ORDER — ADULT MULTIVITAMIN W/MINERALS CH: 1.0000 | ORAL_TABLET | Freq: Every day | ORAL | Status: DC

## 2019-02-13 MED ORDER — ONDANSETRON HCL 4 MG/2ML IJ SOLN
INTRAMUSCULAR | Status: AC
  Filled 2019-02-13: qty 2

## 2019-02-13 MED ORDER — ONDANSETRON HCL 4 MG/2ML IJ SOLN: 4.0000 mg | Freq: Once | INTRAMUSCULAR | Status: DC | PRN

## 2019-02-13 MED ORDER — CEFAZOLIN SODIUM-DEXTROSE 2-4 GM/100ML-% IV SOLN
2.0000 g | INTRAVENOUS | Status: AC
  Administered 2019-02-13: 2 g via INTRAVENOUS
  Filled 2019-02-13: qty 100

## 2019-02-13 MED ORDER — SODIUM CHLORIDE 0.9 % IV SOLN
INTRAVENOUS | Status: DC | PRN
  Administered 2019-02-13: 500 mL

## 2019-02-13 MED ORDER — THROMBIN 5000 UNITS EX SOLR
CUTANEOUS | Status: DC | PRN
  Administered 2019-02-13 (×2): 5000 [IU] via TOPICAL

## 2019-02-13 MED ORDER — SODIUM CHLORIDE 0.9% FLUSH
3.0000 mL | Freq: Two times a day (BID) | INTRAVENOUS | Status: DC
  Administered 2019-02-13: 3 mL via INTRAVENOUS

## 2019-02-13 MED ORDER — METHYLPREDNISOLONE ACETATE 80 MG/ML IJ SUSP
INTRAMUSCULAR | Status: AC
  Filled 2019-02-13: qty 1

## 2019-02-13 MED ORDER — HYDROXYZINE HCL 25 MG PO TABS: 50.0000 mg | ORAL_TABLET | ORAL | Status: DC | PRN

## 2019-02-13 MED ORDER — CYCLOBENZAPRINE HCL 10 MG PO TABS: 10.0000 mg | ORAL_TABLET | Freq: Three times a day (TID) | ORAL | Status: DC | PRN

## 2019-02-13 MED ORDER — FLEET ENEMA 7-19 GM/118ML RE ENEM: 1.0000 | ENEMA | Freq: Once | RECTAL | Status: DC | PRN

## 2019-02-13 MED ORDER — MORPHINE SULFATE (PF) 4 MG/ML IV SOLN: 4.0000 mg | INTRAVENOUS | Status: DC | PRN

## 2019-02-13 MED ORDER — ALPRAZOLAM 0.25 MG PO TABS: 0.2500 mg | ORAL_TABLET | Freq: Every day | ORAL | Status: DC | PRN

## 2019-02-13 MED ORDER — THROMBIN 5000 UNITS EX SOLR
CUTANEOUS | Status: AC
  Filled 2019-02-13: qty 5000

## 2019-02-13 MED ORDER — LIDOCAINE 2% (20 MG/ML) 5 ML SYRINGE
INTRAMUSCULAR | Status: DC | PRN
  Administered 2019-02-13: 60 mg via INTRAVENOUS

## 2019-02-13 MED ORDER — ROCURONIUM BROMIDE 10 MG/ML (PF) SYRINGE
PREFILLED_SYRINGE | INTRAVENOUS | Status: AC
  Filled 2019-02-13: qty 10

## 2019-02-13 MED ORDER — EZETIMIBE 10 MG PO TABS: 10.0000 mg | ORAL_TABLET | Freq: Every day | ORAL | Status: DC

## 2019-02-13 MED ORDER — ALUM & MAG HYDROXIDE-SIMETH 200-200-20 MG/5ML PO SUSP: 30.0000 mL | Freq: Four times a day (QID) | ORAL | Status: DC | PRN

## 2019-02-13 MED ORDER — PROPOFOL 10 MG/ML IV BOLUS
INTRAVENOUS | Status: DC | PRN
  Administered 2019-02-13: 150 mg via INTRAVENOUS

## 2019-02-13 MED ORDER — HYDROXYZINE HCL 50 MG/ML IM SOLN: 50.0000 mg | INTRAMUSCULAR | Status: DC | PRN

## 2019-02-13 MED ORDER — LACTATED RINGERS IV SOLN
INTRAVENOUS | Status: DC | PRN
  Administered 2019-02-13 (×2): via INTRAVENOUS

## 2019-02-13 MED ORDER — SUGAMMADEX SODIUM 200 MG/2ML IV SOLN
INTRAVENOUS | Status: DC | PRN
  Administered 2019-02-13: 180 mg via INTRAVENOUS

## 2019-02-13 MED ORDER — DEXAMETHASONE SODIUM PHOSPHATE 10 MG/ML IJ SOLN
INTRAMUSCULAR | Status: AC
  Filled 2019-02-13: qty 1

## 2019-02-13 MED ORDER — ONDANSETRON HCL 4 MG/2ML IJ SOLN: 4.0000 mg | Freq: Four times a day (QID) | INTRAMUSCULAR | Status: DC | PRN

## 2019-02-13 MED ORDER — QUINAPRIL HCL 10 MG PO TABS
40.0000 mg | ORAL_TABLET | Freq: Every day | ORAL | Status: DC
  Filled 2019-02-13: qty 4

## 2019-02-13 MED ORDER — ONDANSETRON HCL 4 MG/2ML IJ SOLN
INTRAMUSCULAR | Status: DC | PRN
  Administered 2019-02-13: 4 mg via INTRAVENOUS

## 2019-02-13 MED ORDER — METOPROLOL TARTRATE 12.5 MG HALF TABLET: 12.5000 mg | ORAL_TABLET | Freq: Two times a day (BID) | ORAL | Status: DC

## 2019-02-13 MED ORDER — ACETAMINOPHEN 500 MG PO TABS
1000.0000 mg | ORAL_TABLET | Freq: Once | ORAL | Status: AC
  Administered 2019-02-13: 07:00:00 1000 mg via ORAL
  Filled 2019-02-13: qty 2

## 2019-02-13 MED ORDER — ROSUVASTATIN CALCIUM 20 MG PO TABS: 20.0000 mg | ORAL_TABLET | Freq: Every day | ORAL | Status: DC

## 2019-02-13 MED ORDER — PHENYLEPHRINE 40 MCG/ML (10ML) SYRINGE FOR IV PUSH (FOR BLOOD PRESSURE SUPPORT)
PREFILLED_SYRINGE | INTRAVENOUS | Status: AC
  Filled 2019-02-13: qty 10

## 2019-02-13 MED ORDER — SODIUM CHLORIDE 0.9% FLUSH: 3.0000 mL | INTRAVENOUS | Status: DC | PRN

## 2019-02-13 MED ORDER — KETOROLAC TROMETHAMINE 30 MG/ML IJ SOLN
INTRAMUSCULAR | Status: AC
  Filled 2019-02-13: qty 1

## 2019-02-13 MED ORDER — KCL IN DEXTROSE-NACL 20-5-0.45 MEQ/L-%-% IV SOLN: INTRAVENOUS | Status: DC

## 2019-02-13 MED ORDER — PHENYLEPHRINE 40 MCG/ML (10ML) SYRINGE FOR IV PUSH (FOR BLOOD PRESSURE SUPPORT)
PREFILLED_SYRINGE | INTRAVENOUS | Status: DC | PRN
  Administered 2019-02-13: 80 ug via INTRAVENOUS
  Administered 2019-02-13: 120 ug via INTRAVENOUS
  Administered 2019-02-13: 40 ug via INTRAVENOUS
  Administered 2019-02-13: 80 ug via INTRAVENOUS

## 2019-02-13 MED ORDER — PROPOFOL 10 MG/ML IV BOLUS
INTRAVENOUS | Status: AC
  Filled 2019-02-13: qty 20

## 2019-02-13 MED ORDER — ROCURONIUM BROMIDE 50 MG/5ML IV SOSY
PREFILLED_SYRINGE | INTRAVENOUS | Status: DC | PRN
  Administered 2019-02-13: 60 mg via INTRAVENOUS

## 2019-02-13 MED ORDER — CYCLOBENZAPRINE HCL 10 MG PO TABS: 5.0000 mg | ORAL_TABLET | Freq: Three times a day (TID) | ORAL | 1 refills | Status: DC | PRN

## 2019-02-13 MED ORDER — HYDROCODONE-ACETAMINOPHEN 5-325 MG PO TABS
1.0000 | ORAL_TABLET | ORAL | Status: DC | PRN
  Administered 2019-02-13: 18:00:00 1 via ORAL
  Filled 2019-02-13: qty 1

## 2019-02-13 MED ORDER — METHYLPREDNISOLONE ACETATE 80 MG/ML IJ SUSP
INTRAMUSCULAR | Status: DC | PRN
  Administered 2019-02-13: 80 mg

## 2019-02-13 MED ORDER — ONDANSETRON HCL 4 MG PO TABS: 4.0000 mg | ORAL_TABLET | Freq: Four times a day (QID) | ORAL | Status: DC | PRN

## 2019-02-13 MED ORDER — BISACODYL 10 MG RE SUPP: 10.0000 mg | Freq: Every day | RECTAL | Status: DC | PRN

## 2019-02-13 MED ORDER — LIDOCAINE 2% (20 MG/ML) 5 ML SYRINGE
INTRAMUSCULAR | Status: AC
  Filled 2019-02-13: qty 5

## 2019-02-13 MED ORDER — KETOROLAC TROMETHAMINE 30 MG/ML IJ SOLN
15.0000 mg | Freq: Four times a day (QID) | INTRAMUSCULAR | Status: DC
  Administered 2019-02-13 (×2): 15 mg via INTRAVENOUS
  Filled 2019-02-13: qty 1

## 2019-02-13 MED ORDER — HEMOSTATIC AGENTS (NO CHARGE) OPTIME
TOPICAL | Status: DC | PRN
  Administered 2019-02-13: 1 via TOPICAL

## 2019-02-13 MED ORDER — BUPIVACAINE HCL (PF) 0.5 % IJ SOLN
INTRAMUSCULAR | Status: AC
  Filled 2019-02-13: qty 30

## 2019-02-13 SURGICAL SUPPLY — 61 items
ADH SKN CLS APL DERMABOND .7 (GAUZE/BANDAGES/DRESSINGS) ×1
APL SKNCLS STERI-STRIP NONHPOA (GAUZE/BANDAGES/DRESSINGS)
BAG DECANTER FOR FLEXI CONT (MISCELLANEOUS) ×2 IMPLANT
BENZOIN TINCTURE PRP APPL 2/3 (GAUZE/BANDAGES/DRESSINGS) IMPLANT
BLADE CLIPPER SURG (BLADE) IMPLANT
BUR ACRON 5.0MM COATED (BURR) ×1 IMPLANT
BUR MATCHSTICK NEURO 3.0 LAGG (BURR) ×2 IMPLANT
CANISTER SUCT 3000ML PPV (MISCELLANEOUS) ×2 IMPLANT
CARTRIDGE OIL MAESTRO DRILL (MISCELLANEOUS) ×1 IMPLANT
COVER WAND RF STERILE (DRAPES) ×2 IMPLANT
DECANTER SPIKE VIAL GLASS SM (MISCELLANEOUS) ×2 IMPLANT
DERMABOND ADVANCED (GAUZE/BANDAGES/DRESSINGS) ×1
DERMABOND ADVANCED .7 DNX12 (GAUZE/BANDAGES/DRESSINGS) ×1 IMPLANT
DIFFUSER DRILL AIR PNEUMATIC (MISCELLANEOUS) ×2 IMPLANT
DRAPE LAPAROTOMY 100X72X124 (DRAPES) ×2 IMPLANT
DRAPE MICROSCOPE LEICA (MISCELLANEOUS) ×2 IMPLANT
DRAPE POUCH INSTRU U-SHP 10X18 (DRAPES) ×2 IMPLANT
ELECT REM PT RETURN 9FT ADLT (ELECTROSURGICAL) ×2
ELECTRODE REM PT RTRN 9FT ADLT (ELECTROSURGICAL) ×1 IMPLANT
GAUZE 4X4 16PLY RFD (DISPOSABLE) IMPLANT
GAUZE SPONGE 4X4 12PLY STRL (GAUZE/BANDAGES/DRESSINGS) ×1 IMPLANT
GLOVE BIO SURGEON STRL SZ 6.5 (GLOVE) ×2 IMPLANT
GLOVE BIOGEL PI IND STRL 6.5 (GLOVE) IMPLANT
GLOVE BIOGEL PI IND STRL 8 (GLOVE) ×1 IMPLANT
GLOVE BIOGEL PI INDICATOR 6.5 (GLOVE) ×1
GLOVE BIOGEL PI INDICATOR 8 (GLOVE) ×1
GLOVE ECLIPSE 7.5 STRL STRAW (GLOVE) ×2 IMPLANT
GLOVE EXAM NITRILE XL STR (GLOVE) IMPLANT
GOWN STRL REUS W/ TWL LRG LVL3 (GOWN DISPOSABLE) IMPLANT
GOWN STRL REUS W/ TWL XL LVL3 (GOWN DISPOSABLE) ×1 IMPLANT
GOWN STRL REUS W/TWL 2XL LVL3 (GOWN DISPOSABLE) IMPLANT
GOWN STRL REUS W/TWL LRG LVL3 (GOWN DISPOSABLE)
GOWN STRL REUS W/TWL XL LVL3 (GOWN DISPOSABLE) ×2
HEMOSTAT POWDER SURGIFOAM 1G (HEMOSTASIS) ×1 IMPLANT
KIT BASIN OR (CUSTOM PROCEDURE TRAY) ×2 IMPLANT
KIT TURNOVER KIT B (KITS) ×2 IMPLANT
NDL HYPO 18GX1.5 BLUNT FILL (NEEDLE) IMPLANT
NDL SPNL 18GX3.5 QUINCKE PK (NEEDLE) ×1 IMPLANT
NDL SPNL 22GX3.5 QUINCKE BK (NEEDLE) ×1 IMPLANT
NEEDLE HYPO 18GX1.5 BLUNT FILL (NEEDLE) ×2 IMPLANT
NEEDLE SPNL 18GX3.5 QUINCKE PK (NEEDLE) ×2 IMPLANT
NEEDLE SPNL 22GX3.5 QUINCKE BK (NEEDLE) ×2 IMPLANT
NS IRRIG 1000ML POUR BTL (IV SOLUTION) ×2 IMPLANT
OIL CARTRIDGE MAESTRO DRILL (MISCELLANEOUS) ×2
PACK LAMINECTOMY NEURO (CUSTOM PROCEDURE TRAY) ×2 IMPLANT
PAD ARMBOARD 7.5X6 YLW CONV (MISCELLANEOUS) ×3 IMPLANT
PATTIES SURGICAL .5 X1 (DISPOSABLE) ×1 IMPLANT
RUBBERBAND STERILE (MISCELLANEOUS) ×4 IMPLANT
SPONGE LAP 4X18 RFD (DISPOSABLE) IMPLANT
SPONGE SURGIFOAM ABS GEL SZ50 (HEMOSTASIS) ×2 IMPLANT
STRIP CLOSURE SKIN 1/2X4 (GAUZE/BANDAGES/DRESSINGS) IMPLANT
SUT PROLENE 6 0 BV (SUTURE) IMPLANT
SUT VIC AB 1 CT1 18XBRD ANBCTR (SUTURE) ×1 IMPLANT
SUT VIC AB 1 CT1 8-18 (SUTURE) ×6
SUT VIC AB 2-0 CP2 18 (SUTURE) ×2 IMPLANT
SUT VIC AB 3-0 SH 8-18 (SUTURE) IMPLANT
SYR 5ML LL (SYRINGE) ×1 IMPLANT
TAPE CLOTH SURG 6X10 WHT LF (GAUZE/BANDAGES/DRESSINGS) ×1 IMPLANT
TOWEL GREEN STERILE (TOWEL DISPOSABLE) ×2 IMPLANT
TOWEL GREEN STERILE FF (TOWEL DISPOSABLE) ×2 IMPLANT
WATER STERILE IRR 1000ML POUR (IV SOLUTION) ×2 IMPLANT

## 2019-02-13 NOTE — Op Note (Signed)
02/13/2019  9:33 AM  PATIENT:  James Jacobs  77 y.o. 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 Patient was turned to prone position the lumbar region was prepped with Betadine soap and solution and draped in a sterile fashion. The midline was infiltrated with local anesthetic with epinephrine. A localizing x-ray was taken and the L4 level was identified. Midline incision was made over the L4 level and was carried down through the subcutaneous tissue to the lumbar fascia. The lumbar fascia was incised on the right side and the paraspinal muscles were dissected from the spinous processes and lamina in a subperiosteal fashion. Another x-ray was taken and the right L3-4 and L4-5 intralaminar spaces were identified. The operating microscope was draped and brought into the field provided additional magnification, illumination, and visualization.  A right L4 hemilaminectomy was performed using the high-speed drill and Kerrison punches. The ligamentum flavum was carefully resected. The thecal sac was identified.  The epidural space was explored and the L4-5 annulus was identified.  We explored rostrally behind the body of L4 and encountered the disc herniation which was removed in a piecemeal fashion.   The exiting right L4 nerve root was identified as was the right L4 pedicle.  There was disc herniation rostral to the exiting right L4 nerve root, and therefore we explored rostral and caudal to the exiting right L4 nerve root.  Additional free fragment disc herniation was removed, and good decompression of the thecal sac and exiting nerve root was achieved.  In the end all loose fragments of disc material were removed from the epidural space and good neural decompression was confirmed.  Hemostasis was established with the use of bipolar cautery and Surgifoam.  A thin layer Surgifoam was left in place, and hemostasis confirmed. We then instilled 2 cc of fentanyl and 80 mg of Depo-Medrol into the epidural space. Deep fascia was closed with interrupted undyed 1 Vicryl sutures. Scarpa's fascia was closed with interrupted undyed 1 Vicryl sutures in the subcutaneous and subcuticular layer were closed with interrupted inverted 2-0 undyed Vicryl sutures. The skin edges were approximated with Dermabond. Following surgery the patient was turned back to a supine position to be reversed from the anesthetic extubated and transferred to the recovery room for further care.  PLAN OF CARE: Admit for overnight observation  PATIENT DISPOSITION:  PACU - hemodynamically stable.   Delay start of Pharmacological VTE agent (>24hrs) due to surgical blood loss or risk of bleeding:  yes

## 2019-02-13 NOTE — Anesthesia Postprocedure Evaluation (Signed)
Anesthesia Post Note  Patient: James Jacobs  Procedure(s) Performed: RIGHT LUMBAR FOUR  HEMILAMINOTOMY AND MICRODISCECTOMY (Right )     Patient location during evaluation: PACU Anesthesia Type: General Level of consciousness: awake Pain management: pain level controlled Vital Signs Assessment: post-procedure vital signs reviewed and stable Respiratory status: spontaneous breathing, nonlabored ventilation, respiratory function stable and patient connected to nasal cannula oxygen Cardiovascular status: blood pressure returned to baseline and stable Postop Assessment: no apparent nausea or vomiting Anesthetic complications: no    Last Vitals:  Vitals:   02/13/19 1225 02/13/19 1557  BP: (!) 146/74 (!) 155/87  Pulse: 82 96  Resp: 16 16  Temp: 36.5 C 36.6 C  SpO2: 99% 96%    Last Pain:  Vitals:   02/13/19 1557  TempSrc: Oral  PainSc:                  James Jacobs James Jacobs

## 2019-02-13 NOTE — Discharge Summary (Signed)
Physician Discharge Summary  Patient ID: James Jacobs MRN: [id number] DOB/AGE: 1940-06-12 78 y.o.  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  Postoperatively he has had excellent relief of his radicular pain.  He is up and ambulating actively.  He has voided.  His incision is healing nicely.  There is no erythema, swelling, or drainage.  He is asking to be discharged home.  We have given him instructions regarding wound care and activities following discharge.  He is scheduled for follow-up with me in the office on October 2.  Discharge Exam: Blood pressure (!) 155/87, pulse 96, temperature 97.8 F (36.6 C), temperature source Oral, resp. rate 16, height 5\' 8"  (1.727 m), weight 85.3 kg, SpO2 96 %.  Disposition: Discharge disposition: 01-Home or Self Care       Discharge Instructions    Discharge wound care:   Complete by: As directed    Leave the wound open to air. Shower daily with the wound uncovered. Water and soapy water should run over the incision area. Do not wash directly on the incision for 2 weeks. Remove the glue after 2 weeks.   Driving Restrictions   Complete by: As directed    No driving for 2 weeks. May ride in the car locally now. May begin to drive locally in 2 weeks.   Other Restrictions   Complete by: As directed    Walk gradually increasing distances out in the fresh air at least twice a day. Walking additional 6 times inside the house, gradually increasing distances, daily. No bending, lifting, or twisting.  Perform activities between shoulder and waist height (that is at counter height when standing or table height when sitting).     Allergies as of 02/13/2019   No Known Allergies     Medication List    TAKE these medications   acetaminophen 500 MG tablet Commonly known as: TYLENOL Take 1,000 mg by mouth every 6 (six) hours as needed for moderate pain or headache.   ALPRAZolam 0.25 MG tablet Commonly known as: XANAX Take 0.25 mg by mouth daily as needed for anxiety or sleep.   aspirin 81 MG tablet Take 81 mg by mouth daily.   AUSTRALIAN DREAM ARTHRITIS EX Apply 1 application topically daily as needed (back pain).   cholecalciferol 1000 units tablet Commonly known as: VITAMIN D Take 1,000 Units by mouth daily.   cyclobenzaprine 10 MG tablet Commonly known as: FLEXERIL Take 0.5-1 tablets (5-10 mg total) by mouth 3 (three) times daily as needed for muscle spasms.   diclofenac 75 MG EC tablet Commonly known as: VOLTAREN Take 75 mg by mouth daily.   diltiazem 180 MG 24 hr capsule Commonly known as: CARDIZEM CD Take 1 capsule (180 mg total) by mouth daily.   ezetimibe 10 MG tablet Commonly known as: ZETIA Take 10 mg by mouth daily.   HYDROcodone-acetaminophen 5-325 MG tablet Commonly known as: NORCO/VICODIN Take 1-2 tablets by mouth every 4 (four) hours as needed (pain).   metoprolol tartrate 25 MG tablet Commonly known as: LOPRESSOR Take 0.5 tablets (12.5 mg total) by mouth 2 (two) times daily.   multivitamin with minerals Tabs  tablet Take 1 tablet by mouth daily.   OVER THE COUNTER MEDICATION Take 1 capsule by mouth at bedtime. Curamin PM   quinapril 40 MG tablet Commonly known as: ACCUPRIL Take 40 mg by mouth daily.   rosuvastatin 20 MG tablet Commonly known as: CRESTOR Take 20 mg by mouth daily.   spironolactone 25 MG tablet Commonly known as: ALDACTONE Take 1 tablet (25 mg total) by mouth daily. What changed: how much to take   tadalafil 20 MG  tablet Commonly known as: CIALIS Take 5 mg by mouth daily.            Discharge Care Instructions  (From admission, onward)         Start     Ordered   02/13/19 0000  Discharge wound care:    Comments: Leave the wound open to air. Shower daily with the wound uncovered. Water and soapy water should run over the incision area. Do not wash directly on the incision for 2 weeks. Remove the glue after 2 weeks.   02/13/19 1730           Signed: Hewitt ShortsNUDELMAN,ROBERT W 02/13/2019, 5:30 PM

## 2019-02-13 NOTE — Discharge Instructions (Signed)
°  Call Your Doctor If Any of These Occur °Redness, drainage, or swelling at the wound.  °Temperature greater than 101 degrees. °Severe pain not relieved by pain medication. °Incision starts to come apart. °Follow Up Appt °Call today for appointment in 3 weeks ([phone]) or for problems.  If you have any hardware placed in your spine, you will need an x-ray before your appointment. °

## 2019-02-13 NOTE — Progress Notes (Signed)
Discharged instructions/education/AVS/Rx given to patient with wife at bedside and they both verbalized understanding. MAE well, ambulating with supervision. Pain is mild per patient. No drainage, no swelling noted on incision site. Patient voiding well with no difficulty.Swallows well with no complaints. Patient awaiting for his transport home.

## 2019-02-13 NOTE — Transfer of Care (Signed)
Immediate Anesthesia Transfer of Care Note  Patient: James Jacobs  Procedure(s) Performed: RIGHT LUMBAR FOUR  HEMILAMINOTOMY AND MICRODISCECTOMY (Right )  Patient Location: PACU  Anesthesia Type:General  Level of Consciousness: drowsy and patient cooperative  Airway & Oxygen Therapy: Patient Spontanous Breathing  Post-op Assessment: Report given to RN and Post -op Vital signs reviewed and stable  Post vital signs: Reviewed and stable  Last Vitals:  Vitals Value Taken Time  BP 177/86 02/13/19 0941  Temp    Pulse 87 02/13/19 0942  Resp 18 02/13/19 0942  SpO2 95 % 02/13/19 0942  Vitals shown include unvalidated device data.  Last Pain:  Vitals:   02/13/19 0704  PainSc: 5       Patients Stated Pain Goal: 3 (57/84/69 6295)  Complications: No apparent anesthesia complications

## 2019-02-13 NOTE — H&P (Signed)
Subjective: Patient is a 78 y.o. 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  We had been treating him for a left lumbar radiculopathy with left EHL weakness (4-/5) with left L5-S1 translaminar epidural start injections.  2-1/2 months ago he developed an acute right lumbar radiculopathy with pain in the lower lumbar region, across the upper right buttock, into the lateral right hip, and into the anterior right thigh and knee.  On examination he was found to have significant weakness of the right iliopsoas (4-/5).  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  He is admitted now for a right L4 hemilaminotomy and microdiscectomy.   Patient Active Problem List   Diagnosis Date Noted  . Essential hypertension 05/24/2018  . SVT (supraventricular tachycardia) (HCC) 04/27/2018  . Elevated serum creatinine 04/27/2018  . Diabetes mellitus without complication (HCC)   . Dyspnea on exertion 04/29/2012  . Pure hypercholesterolemia 04/29/2012  . Benign hypertensive heart disease without heart failure 04/29/2012   Past Medical History:  Diagnosis Date  . Diabetes mellitus without complication (HCC)    denies  . Early cataracts, bilateral   . History of nuclear stress test 2013   low risk study  . HNP (herniated nucleus pulposus), lumbar   . Hypercholesteremia   . Hypertension   . Pre-diabetes    per pt ; no medication  . SVT (supraventricular tachycardia) (HCC)   . Wears contact lenses     Past Surgical History:  Procedure Laterality Date  . COLONOSCOPY    . TONSILLECTOMY      Medications Prior to Admission  Medication Sig Dispense Refill Last Dose  . acetaminophen (TYLENOL)  500 MG tablet Take 1,000 mg by mouth every 6 (six) hours as needed for moderate pain or headache.   02/12/2019 at Unknown time  . aspirin 81 MG tablet Take 81 mg by mouth daily.   Past Week at Unknown time  . cholecalciferol (VITAMIN D) 1000 UNITS tablet Take 1,000 Units by mouth daily.   Past Week at Unknown time  . diclofenac (VOLTAREN) 75 MG EC tablet Take 75 mg by mouth daily.   Past Week at Unknown time  . diltiazem (CARDIZEM CD) 180 MG 24 hr capsule Take 1 capsule (180 mg total) by mouth daily. 30 capsule 5   . ezetimibe (ZETIA) 10 MG tablet Take 10 mg by mouth daily.   02/13/2019 at 0400  . Histamine Dihydrochloride (AUSTRALIAN DREAM ARTHRITIS EX) Apply 1 application topically daily as needed (back pain).   Past Week at Unknown time  . metoprolol tartrate (LOPRESSOR) 25 MG tablet Take 0.5 tablets (12.5 mg total) by mouth 2 (two) times daily. 15 tablet 3 02/13/2019 at 0400  . Multiple Vitamin (MULTIVITAMIN WITH MINERALS) TABS Take 1 tablet by mouth daily.   Past Week at Unknown time  . OVER THE COUNTER MEDICATION Take 1 capsule by mouth at bedtime. Curamin PM   02/12/2019 at Unknown time  . quinapril (ACCUPRIL) 40 MG tablet Take 40 mg by mouth daily.    02/12/2019 at Unknown time  . rosuvastatin (CRESTOR) 20 MG tablet Take 20 mg by mouth daily.   02/13/2019 at 0400  . spironolactone (ALDACTONE) 25 MG  tablet Take 1 tablet (25 mg total) by mouth daily. (Patient taking differently: Take 12.5 mg by mouth daily. ) 30 tablet 5   . ALPRAZolam (XANAX) 0.25 MG tablet Take 0.25 mg by mouth daily as needed for anxiety or sleep.    Unknown at Unknown time  . tadalafil (CIALIS) 20 MG tablet Take 5 mg by mouth daily.   Unknown at Unknown time   No Known Allergies  Social History   Tobacco Use  . Smoking status: Never Smoker  . Smokeless tobacco: Never Used  Substance Use Topics  . Alcohol use: Yes    Comment: occasional glass of wine    History reviewed. No pertinent family history.   Review of  Systems Pertinent items noted in HPI and remainder of comprehensive ROS otherwise negative.  Objective: Vital signs in last 24 hours: Temp:  [98.5 F (36.9 C)] 98.5 F (36.9 C) (09/10 0615) Pulse Rate:  [76] 76 (09/10 0615) Resp:  [20] 20 (09/10 0615) BP: (185)/(86) 185/86 (09/10 0615) SpO2:  [96 %] 96 % (09/10 0615) Weight:  [85.3 kg] 85.3 kg (09/10 0704)  EXAM: Patient is well-developed well-nourished white male in discomfort, but no acute distress.   Lungs are clear to auscultation , the patient has symmetrical respiratory excursion. Heart has a regular rate and rhythm normal S1 and S2 no murmur.   Abdomen is soft nontender nondistended bowel sounds are present. Extremity examination shows no clubbing cyanosis or edema. Motor examination shows left iliopsoas is 5, right iliopsoas is 4-, quadriceps are 5 bilaterally, dorsiflexor and plantar flexor are 5 bilaterally, left EHL is 4-, right EHL is 5.  Sensation is intact to pinprick in the thighs, legs, and feet bilaterally.  Reflex examination shows left quadriceps is trace, right quadriceps is minimal, gastrocnemius are absent bilaterally, toes are downgoing bilaterally.  Gait and stance favor the right lower extremity.  Data Review:CBC    Component Value Date/Time   WBC 11.5 (H) 04/27/2018 1243   RBC 5.23 04/27/2018 1243   HGB 15.9 04/27/2018 1243   HCT 48.8 04/27/2018 1243   PLT 291 04/27/2018 1243   MCV 93.3 04/27/2018 1243   MCH 30.4 04/27/2018 1243   MCHC 32.6 04/27/2018 1243   RDW 13.2 04/27/2018 1243   LYMPHSABS 3.9 04/27/2018 1243   MONOABS 1.1 (H) 04/27/2018 1243   EOSABS 0.2 04/27/2018 1243   BASOSABS 0.1 04/27/2018 1243                          BMET    Component Value Date/Time   NA 142 11/26/2018 1356   K 4.6 11/26/2018 1356   CL 105 11/26/2018 1356   CO2 22 11/26/2018 1356   GLUCOSE 105 (H) 11/26/2018 1356   GLUCOSE 115 (H) 04/29/2018 0550   BUN 19 11/26/2018 1356   CREATININE 1.08 11/26/2018 1356   CALCIUM  10.7 (H) 11/26/2018 1356   GFRNONAA 64 11/26/2018 1356   GFRAA 75 11/26/2018 1356     Assessment/Plan: Patient with right lumbar radiculopathy secondary to massive right L4-5 lumbar disc condition, who is admitted for lumbar laminotomy microdiscectomy.  I've discussed with the patient the nature of his condition, the nature the surgical procedure, the typical length of surgery, hospital stay, and overall recuperation. We discussed limitations postoperatively. I discussed risks of surgery including risks of infection, bleeding, possibly need for transfusion, the risk of nerve root dysfunction with pain, weakness, numbness, or paresthesias, or risk of dural tear  and CSF leakage and possible need for further surgery, the risk of recurrent disc herniation and the possible need for further surgery, and the risk of anesthetic complications including myocardial infarction, stroke, pneumonia, and death. Understanding all this the patient does wish to proceed with surgery and is admitted for such.  Hosie Spangle, MD 02/13/2019 7:18 AM

## 2019-02-13 NOTE — Anesthesia Procedure Notes (Signed)
Procedure Name: Intubation Date/Time: 02/13/2019 7:37 AM Performed by: Lance Coon, CRNA Pre-anesthesia Checklist: Patient identified, Emergency Drugs available, Suction available, Patient being monitored and Timeout performed Patient Re-evaluated:Patient Re-evaluated prior to induction Oxygen Delivery Method: Circle system utilized Preoxygenation: Pre-oxygenation with 100% oxygen Induction Type: IV induction Ventilation: Mask ventilation without difficulty Laryngoscope Size: Miller and 3 Grade View: Grade I Tube type: Oral Tube size: 7.5 mm Number of attempts: 1 Airway Equipment and Method: Stylet Placement Confirmation: ETT inserted through vocal cords under direct vision,  positive ETCO2 and breath sounds checked- equal and bilateral Secured at: 22 cm Tube secured with: Tape Dental Injury: Teeth and Oropharynx as per pre-operative assessment

## 2019-02-14 ENCOUNTER — Encounter (HOSPITAL_COMMUNITY): Payer: Self-pay | Admitting: Neurosurgery

## 2019-04-04 ENCOUNTER — Encounter (HOSPITAL_COMMUNITY): Payer: Self-pay

## 2019-04-04 ENCOUNTER — Other Ambulatory Visit: Payer: Self-pay

## 2019-04-04 ENCOUNTER — Inpatient Hospital Stay (HOSPITAL_COMMUNITY)
Admission: EM | Admit: 2019-04-04 | Discharge: 2019-04-06 | DRG: 378 | Disposition: A | Discharge status: Home / Self Care | Payer: Medicare PPO | Attending: Family Medicine | Admitting: Family Medicine

## 2019-04-04 DIAGNOSIS — R0609 Other forms of dyspnea: Secondary | ICD-10-CM

## 2019-04-04 DIAGNOSIS — K219 Gastro-esophageal reflux disease without esophagitis: Secondary | ICD-10-CM | POA: Diagnosis present

## 2019-04-04 DIAGNOSIS — E119 Type 2 diabetes mellitus without complications: Secondary | ICD-10-CM | POA: Diagnosis present

## 2019-04-04 DIAGNOSIS — I471 Supraventricular tachycardia, unspecified: Secondary | ICD-10-CM | POA: Diagnosis present

## 2019-04-04 DIAGNOSIS — I10 Essential (primary) hypertension: Secondary | ICD-10-CM | POA: Diagnosis present

## 2019-04-04 DIAGNOSIS — Z20828 Contact with and (suspected) exposure to other viral communicable diseases: Secondary | ICD-10-CM | POA: Diagnosis not present

## 2019-04-04 DIAGNOSIS — K5731 Diverticulosis of large intestine without perforation or abscess with bleeding: Principal | ICD-10-CM | POA: Diagnosis present

## 2019-04-04 DIAGNOSIS — Z7982 Long term (current) use of aspirin: Secondary | ICD-10-CM

## 2019-04-04 DIAGNOSIS — K625 Hemorrhage of anus and rectum: Secondary | ICD-10-CM

## 2019-04-04 DIAGNOSIS — K648 Other hemorrhoids: Secondary | ICD-10-CM | POA: Diagnosis present

## 2019-04-04 DIAGNOSIS — M5126 Other intervertebral disc displacement, lumbar region: Secondary | ICD-10-CM | POA: Diagnosis present

## 2019-04-04 DIAGNOSIS — D62 Acute posthemorrhagic anemia: Secondary | ICD-10-CM | POA: Diagnosis present

## 2019-04-04 DIAGNOSIS — E785 Hyperlipidemia, unspecified: Secondary | ICD-10-CM | POA: Diagnosis not present

## 2019-04-04 DIAGNOSIS — K922 Gastrointestinal hemorrhage, unspecified: Secondary | ICD-10-CM | POA: Diagnosis not present

## 2019-04-04 DIAGNOSIS — F419 Anxiety disorder, unspecified: Secondary | ICD-10-CM | POA: Diagnosis not present

## 2019-04-04 DIAGNOSIS — Z79899 Other long term (current) drug therapy: Secondary | ICD-10-CM

## 2019-04-04 DIAGNOSIS — E7800 Pure hypercholesterolemia, unspecified: Secondary | ICD-10-CM | POA: Diagnosis not present

## 2019-04-04 DIAGNOSIS — Z8249 Family history of ischemic heart disease and other diseases of the circulatory system: Secondary | ICD-10-CM

## 2019-04-04 DIAGNOSIS — Z79891 Long term (current) use of opiate analgesic: Secondary | ICD-10-CM | POA: Diagnosis not present

## 2019-04-04 DIAGNOSIS — K5790 Diverticulosis of intestine, part unspecified, without perforation or abscess without bleeding: Secondary | ICD-10-CM | POA: Diagnosis present

## 2019-04-04 DIAGNOSIS — K644 Residual hemorrhoidal skin tags: Secondary | ICD-10-CM | POA: Diagnosis present

## 2019-04-04 DIAGNOSIS — K641 Second degree hemorrhoids: Secondary | ICD-10-CM | POA: Diagnosis present

## 2019-04-04 DIAGNOSIS — S3669XA Other injury of rectum, initial encounter: Secondary | ICD-10-CM

## 2019-04-04 LAB — CBC WITH DIFFERENTIAL/PLATELET
Abs Immature Granulocytes: 0.02 10*3/uL (ref 0.00–0.07)
Basophils Absolute: 0.1 10*3/uL (ref 0.0–0.1)
Basophils Relative: 1 %
Eosinophils Absolute: 0.1 10*3/uL (ref 0.0–0.5)
Eosinophils Relative: 2 %
HCT: 42.3 % (ref 39.0–52.0)
Hemoglobin: 13.6 g/dL (ref 13.0–17.0)
Immature Granulocytes: 0 %
Lymphocytes Relative: 25 %
Lymphs Abs: 1.8 10*3/uL (ref 0.7–4.0)
MCH: 31.2 pg (ref 26.0–34.0)
MCHC: 32.2 g/dL (ref 30.0–36.0)
MCV: 97 fL (ref 80.0–100.0)
Monocytes Absolute: 0.8 10*3/uL (ref 0.1–1.0)
Monocytes Relative: 10 %
Neutro Abs: 4.6 10*3/uL (ref 1.7–7.7)
Neutrophils Relative %: 62 %
Platelets: 209 10*3/uL (ref 150–400)
RBC: 4.36 MIL/uL (ref 4.22–5.81)
RDW: 12.4 % (ref 11.5–15.5)
WBC: 7.4 10*3/uL (ref 4.0–10.5)
nRBC: 0 % (ref 0.0–0.2)

## 2019-04-04 LAB — COMPREHENSIVE METABOLIC PANEL
ALT: 22 U/L (ref 0–44)
AST: 18 U/L (ref 15–41)
Albumin: 3.9 g/dL (ref 3.5–5.0)
Alkaline Phosphatase: 49 U/L (ref 38–126)
Anion gap: 8 (ref 5–15)
BUN: 17 mg/dL (ref 8–23)
CO2: 24 mmol/L (ref 22–32)
Calcium: 9.5 mg/dL (ref 8.9–10.3)
Chloride: 109 mmol/L (ref 98–111)
Creatinine, Ser: 0.83 mg/dL (ref 0.61–1.24)
GFR calc Af Amer: >60 mL/min (ref >60)
GFR calc non Af Amer: >60 mL/min (ref >60)
Glucose, Bld: 127 mg/dL — ABNORMAL HIGH (ref 70–99)
Potassium: 3.5 mmol/L (ref 3.5–5.1)
Sodium: 141 mmol/L (ref 135–145)
Total Bilirubin: 0.7 mg/dL (ref 0.3–1.2)
Total Protein: 6.7 g/dL (ref 6.5–8.1)

## 2019-04-04 LAB — CBC
HCT: 38.5 % — ABNORMAL LOW (ref 39.0–52.0)
Hemoglobin: 12.3 g/dL — ABNORMAL LOW (ref 13.0–17.0)
MCH: 31.5 pg (ref 26.0–34.0)
MCHC: 31.9 g/dL (ref 30.0–36.0)
MCV: 98.5 fL (ref 80.0–100.0)
Platelets: 202 10*3/uL (ref 150–400)
RBC: 3.91 MIL/uL — ABNORMAL LOW (ref 4.22–5.81)
RDW: 12.6 % (ref 11.5–15.5)
WBC: 7.7 10*3/uL (ref 4.0–10.5)
nRBC: 0 % (ref 0.0–0.2)

## 2019-04-04 LAB — ABO/RH: ABO/RH(D): A POS

## 2019-04-04 LAB — PROTIME-INR
INR: 1 (ref 0.8–1.2)
Prothrombin Time: 13.5 seconds (ref 11.4–15.2)

## 2019-04-04 LAB — TYPE AND SCREEN
ABO/RH(D): A POS
ABO/RH(D): A POS
Antibody Screen: NEGATIVE
Antibody Screen: NEGATIVE

## 2019-04-04 LAB — SARS CORONAVIRUS 2 (TAT 6-24 HRS): SARS Coronavirus 2: NEGATIVE

## 2019-04-04 LAB — HEMOGLOBIN A1C
Hgb A1c MFr Bld: 5.5 % (ref 4.8–5.6)
Mean Plasma Glucose: 111.15 mg/dL

## 2019-04-04 MED ORDER — HYDRALAZINE HCL 20 MG/ML IJ SOLN
10.0000 mg | Freq: Three times a day (TID) | INTRAMUSCULAR | Status: DC | PRN
  Administered 2019-04-04: 10 mg via INTRAVENOUS
  Filled 2019-04-04: qty 1

## 2019-04-04 MED ORDER — METOPROLOL TARTRATE 25 MG PO TABS: 12.5000 mg | ORAL_TABLET | Freq: Two times a day (BID) | ORAL | Status: DC

## 2019-04-04 MED ORDER — QUINAPRIL HCL 10 MG PO TABS: 40.0000 mg | ORAL_TABLET | Freq: Every day | ORAL | Status: DC

## 2019-04-04 MED ORDER — PANTOPRAZOLE SODIUM 40 MG PO TBEC
40.0000 mg | DELAYED_RELEASE_TABLET | Freq: Every day | ORAL | Status: DC
  Administered 2019-04-04 – 2019-04-06 (×3): 40 mg via ORAL
  Filled 2019-04-04 (×3): qty 1

## 2019-04-04 MED ORDER — PANTOPRAZOLE SODIUM 40 MG PO TBEC
40.0000 mg | DELAYED_RELEASE_TABLET | Freq: Once | ORAL | Status: AC
  Administered 2019-04-04: 40 mg via ORAL
  Filled 2019-04-04: qty 1

## 2019-04-04 MED ORDER — SODIUM CHLORIDE 0.9 % IV SOLN
INTRAVENOUS | Status: DC
  Administered 2019-04-04 – 2019-04-06 (×5): via INTRAVENOUS

## 2019-04-04 MED ORDER — EZETIMIBE 10 MG PO TABS
10.0000 mg | ORAL_TABLET | Freq: Every day | ORAL | Status: DC
  Administered 2019-04-04 – 2019-04-06 (×3): 10 mg via ORAL
  Filled 2019-04-04 (×6): qty 1

## 2019-04-04 MED ORDER — ROSUVASTATIN CALCIUM 20 MG PO TABS
20.0000 mg | ORAL_TABLET | Freq: Every day | ORAL | Status: DC
  Administered 2019-04-04 – 2019-04-06 (×3): 20 mg via ORAL
  Filled 2019-04-04 (×6): qty 1

## 2019-04-04 MED ORDER — METOPROLOL TARTRATE 12.5 MG HALF TABLET
12.5000 mg | ORAL_TABLET | Freq: Two times a day (BID) | ORAL | Status: DC
  Administered 2019-04-04 – 2019-04-06 (×5): 12.5 mg via ORAL
  Filled 2019-04-04 (×5): qty 1

## 2019-04-04 MED ORDER — LISINOPRIL 40 MG PO TABS
40.0000 mg | ORAL_TABLET | Freq: Every day | ORAL | Status: DC
  Administered 2019-04-04 – 2019-04-06 (×3): 40 mg via ORAL
  Filled 2019-04-04 (×2): qty 1
  Filled 2019-04-04: qty 4

## 2019-04-04 MED ORDER — ALPRAZOLAM 0.25 MG PO TABS: 0.2500 mg | ORAL_TABLET | Freq: Every day | ORAL | Status: DC | PRN

## 2019-04-04 MED ORDER — ACETAMINOPHEN 500 MG PO TABS
1000.0000 mg | ORAL_TABLET | Freq: Four times a day (QID) | ORAL | Status: DC | PRN
  Administered 2019-04-04: 1000 mg via ORAL
  Filled 2019-04-04: qty 2

## 2019-04-04 MED ORDER — SPIRONOLACTONE 25 MG PO TABS
12.5000 mg | ORAL_TABLET | Freq: Every day | ORAL | Status: DC
  Administered 2019-04-04 – 2019-04-06 (×3): 12.5 mg via ORAL
  Filled 2019-04-04 (×5): qty 0.5

## 2019-04-04 MED ORDER — DILTIAZEM HCL ER COATED BEADS 180 MG PO CP24
180.0000 mg | ORAL_CAPSULE | Freq: Every day | ORAL | Status: DC
  Administered 2019-04-04 – 2019-04-06 (×3): 180 mg via ORAL
  Filled 2019-04-04 (×6): qty 1

## 2019-04-04 NOTE — Progress Notes (Signed)
New Admission Note:   Arrival Method: Stretcher  Mental Orientation: Alert and oriented  Telemetry: Box 6 Assessment: Completed Skin: intact  IV: Right forearm  Pain: 3/10 back pain  Tubes: none  Safety Measures: Safety Fall Prevention Plan has been given, discussed and signed Admission: Completed [facility] Orientation: Patient has been orientated to the room, unit and staff.  Family: none  Orders have been reviewed and implemented. Will continue to monitor the patient. Call light has been placed within reach and bed alarm has been activated.   Jasani Dolney RN [facility] Phone: [phone]

## 2019-04-04 NOTE — ED Provider Notes (Signed)
[facility] EMERGENCY DEPARTMENT Provider Note   CSN: [id number] Arrival date & time: 04/04/19  0607     History   Chief Complaint Chief Complaint  Patient presents with  . Rectal Bleeding    HPI James Jacobs is a 78 y.o. male.     HPI  This patient is an 78 year old male with a known history of diabetes, low risk nuclear stress test, history of SVT on baby aspirin.  Presents with rectal bleeding which started this morning, has had several bowel movements now which have had both bright and dark red blood.  Reports that the stool no longer looks brown but is now reddish, he does not have any abdominal pain, he has mild rectal tenderness, known history of hemorrhoids.  Symptoms are persistent, he denies lightheadedness syncope shortness of breath or exertional symptoms.  Has never had any rectal bleeding before.  Thinks he may have a history of diverticulosis.  Colonoscopy was performed in [place], does not have a local GI doctor.  That being said review of the medical record shows that he had a colonoscopy in 2008 which showed diverticulosis.  Past Medical History:  Diagnosis Date  . Diabetes mellitus without complication (HCC)    denies  . Early cataracts, bilateral   . History of nuclear stress test 2013   low risk study  . HNP (herniated nucleus pulposus), lumbar   . Hypercholesteremia   . Hypertension   . Pre-diabetes    per pt ; no medication  . SVT (supraventricular tachycardia) (HCC)   . Wears contact lenses     Patient Active Problem List   Diagnosis Date Noted  . Lumbar disc herniation 02/13/2019  . Essential hypertension 05/24/2018  . SVT (supraventricular tachycardia) (HCC) 04/27/2018  . Elevated serum creatinine 04/27/2018  . Diabetes mellitus without complication (HCC)   . Dyspnea on exertion 04/29/2012  . Pure hypercholesterolemia 04/29/2012  . Benign hypertensive heart disease without heart failure 04/29/2012    Past Surgical History:  Procedure  Laterality Date  . COLONOSCOPY    . LUMBAR LAMINECTOMY/DECOMPRESSION MICRODISCECTOMY Right 02/13/2019   Procedure: RIGHT LUMBAR FOUR  HEMILAMINOTOMY AND MICRODISCECTOMY;  Surgeon: Shirlean Kelly, MD;  Location: [facility] OR;  Service: Neurosurgery;  Laterality: Right;  . TONSILLECTOMY          Home Medications    Prior to Admission medications   Medication Sig Start Date End Date Taking? Authorizing Provider  acetaminophen (TYLENOL) 500 MG tablet Take 1,000 mg by mouth every 6 (six) hours as needed for moderate pain or headache.    [provider]  ALPRAZolam Prudy Feeler) 0.25 MG tablet Take 0.25 mg by mouth daily as needed for anxiety or sleep.     [provider]  aspirin 81 MG tablet Take 81 mg by mouth daily.    [provider]  cholecalciferol (VITAMIN D) 1000 UNITS tablet Take 1,000 Units by mouth daily.    [provider]  cyclobenzaprine (FLEXERIL) 10 MG tablet Take 0.5-1 tablets (5-10 mg total) by mouth 3 (three) times daily as needed for muscle spasms. 02/13/19   Shirlean Kelly, MD  diclofenac (VOLTAREN) 75 MG EC tablet Take 75 mg by mouth daily. 01/23/19   [provider]  diltiazem (CARDIZEM CD) 180 MG 24 hr capsule Take 1 capsule (180 mg total) by mouth daily. 11/12/18 02/11/19  Runell Gess, MD  ezetimibe (ZETIA) 10 MG tablet Take 10 mg by mouth daily.    [provider]  Histamine Dihydrochloride (  AUSTRALIAN DREAM ARTHRITIS EX) Apply 1 application topically daily as needed (back pain).    [provider]  HYDROcodone-acetaminophen (NORCO/VICODIN) 5-325 MG tablet Take 1-2 tablets by mouth every 4 (four) hours as needed (pain). 02/13/19   Jovita Gamma, MD  metoprolol tartrate (LOPRESSOR) 25 MG tablet Take 0.5 tablets (12.5 mg total) by mouth 2 (two) times daily. 12/27/18 03/27/19  Erlene Quan, PA-C  Multiple Vitamin (MULTIVITAMIN WITH MINERALS) TABS Take 1 tablet by mouth daily.    [provider]  OVER THE  COUNTER MEDICATION Take 1 capsule by mouth at bedtime. Curamin PM    [provider]  quinapril (ACCUPRIL) 40 MG tablet Take 40 mg by mouth daily.     [provider]  rosuvastatin (CRESTOR) 20 MG tablet Take 20 mg by mouth daily.    [provider]  spironolactone (ALDACTONE) 25 MG tablet Take 1 tablet (25 mg total) by mouth daily. Patient taking differently: Take 12.5 mg by mouth daily.  11/12/18 02/11/19  Lorretta Harp, MD  tadalafil (CIALIS) 20 MG tablet Take 5 mg by mouth daily.    [provider]    Family History No family history on file.  Social History Social History   Tobacco Use  . Smoking status: Never Smoker  . Smokeless tobacco: Never Used  Substance Use Topics  . Alcohol use: Yes    Comment: occasional glass of wine  . Drug use: Never     Allergies   Patient has no known allergies.   Review of Systems Review of Systems  All other systems reviewed and are negative.    Physical Exam Updated Vital Signs BP (!) 178/87 (BP Location: Right Arm)   Pulse 70   Temp 98.5 F (36.9 C) (Oral)   Resp 19   Ht 1.727 m (5\' 8" )   Wt 85.3 kg   SpO2 96%   BMI 28.59 kg/m   Physical Exam Vitals signs and nursing note reviewed.  Constitutional:      General: He is not in acute distress.    Appearance: He is well-developed.  HENT:     Head: Normocephalic and atraumatic.     Mouth/Throat:     Pharynx: No oropharyngeal exudate.  Eyes:     General: No scleral icterus.       Right eye: No discharge.        Left eye: No discharge.     Conjunctiva/sclera: Conjunctivae normal.     Pupils: Pupils are equal, round, and reactive to light.  Neck:     Musculoskeletal: Normal range of motion and neck supple.     Thyroid: No thyromegaly.     Vascular: No JVD.  Cardiovascular:     Rate and Rhythm: Normal rate and regular rhythm.     Heart sounds: Normal heart sounds. No murmur. No friction rub. No gallop.   Pulmonary:     Effort:  Pulmonary effort is normal. No respiratory distress.     Breath sounds: Normal breath sounds. No wheezing or rales.  Abdominal:     General: Bowel sounds are normal. There is no distension.     Palpations: Abdomen is soft. There is no mass.     Tenderness: There is no abdominal tenderness.     Comments: Nontender abdomen, normal bowel sounds  Genitourinary:    Comments: Multiple hemorrhoids seen, one is red and inflamed but nonbleeding, digital exam shows bright and dark red blood in the rectal vault, see anoscopy notes below.  There is no fissure seen, no thrombosed hemorrhoid Musculoskeletal: Normal range of motion.        General: No tenderness.  Lymphadenopathy:     Cervical: No cervical adenopathy.  Skin:    General: Skin is warm and dry.     Findings: No erythema or rash.  Neurological:     Mental Status: He is alert.     Coordination: Coordination normal.     Comments: Normal speech strength and coordination and memory  Psychiatric:        Behavior: Behavior normal.      ED Treatments / Results  Labs (all labs ordered are listed, but only abnormal results are displayed) Labs Reviewed  COMPREHENSIVE METABOLIC PANEL - Abnormal; Notable for the following components:      Result Value   Glucose, Bld 127 (*)    All other components within normal limits  SARS CORONAVIRUS 2 (TAT 6-24 HRS)  CBC WITH DIFFERENTIAL/PLATELET  PROTIME-INR  TYPE AND SCREEN    EKG None  Radiology No results found.  Procedures LOWER ENDOSCOPY  Date/Time: 04/04/2019 7:33 AM Performed by: Eber Hong, MD Authorized by: Eber Hong, MD  Consent: Verbal consent obtained. Risks and benefits: risks, benefits and alternatives were discussed Consent given by: patient Required items: required blood products, implants, devices, and special equipment available Patient identity confirmed: verbally with patient Time out: Immediately prior to procedure a "time out" was called to verify the  correct patient, procedure, equipment, support staff and site/side marked as required. Indications: rectal bleeding  Sedation: Patient sedated: no  Preparation: Patient was prepped and draped in the usual sterile fashion. Scope type: anoscope External exam performed: yes Positive external exam findings: external hemorrhoids Negative external exam findings: no pilonidal sinus tract Digital exam performed: yes Positive digital exam findings: occult blood in stool Positive internal exam findings: internal hemorrhoid and inflammation Negative internal exam findings: no anal fissures and no abscess Internal hemorrhoid prolapsed: yes Procedure termination: procedure complete Comments: Blood in the rectal vault appeared dark red     (including critical care time)  Medications Ordered in ED Medications  0.9 %  sodium chloride infusion ( Intravenous New Bag/Given 04/04/19 0858)  pantoprazole (PROTONIX) EC tablet 40 mg (40 mg Oral Given 04/04/19 0824)     Initial Impression / Assessment and Plan / ED Course  I have reviewed the triage vital signs and the nursing notes.  Pertinent labs & imaging results that were available during my care of the patient were reviewed by me and considered in my medical decision making (see chart for details).        The patient has had rectal bleeding I suspect this is related to diverticulosis given the appearance of the stool and the lack of actively bleeding bright red blood hemorrhoids.  The patient will need to have labs drawn, he will need to be admitted to the hospital, as we are going into the weekend it would not be safe to send him home to have ongoing bleeding as he has had multiple episodes already to date.  I discussed the care with Dr. Darrick Penna who recommends that the patient be transferred to [facility] for further evaluation as we do not have GI coverage on the weekend.  Discussed the care with Dr. Gwenlyn Perking of the hospitalist  service who will help facilitate the transfer.  The patient has had 3 bowel movements since arrival all which have been dark red blood.  Hemoglobin is 13.6, vital signs appear stable,  the patient does not require an acute transfusion but will require hemodynamic and hemoglobin monitoring.  He has been updated on his results and is agreeable with admission and transfer  Final Clinical Impressions(s) / ED Diagnoses   Final diagnoses:  Rectal bleeding    ED Discharge Orders    None       Eber HongMiller, Cheney Gosch, MD 04/04/19 1011

## 2019-04-04 NOTE — ED Triage Notes (Signed)
Pt states he had a bm this am and noticed bright red blood in the commode.  Pt denies abd pain or other complaints.  Pt states he has had hemorrhoid issues in the past.   Pt has mild rectal pain

## 2019-04-04 NOTE — H&P (Signed)
History and Physical    James Jacobs UMP:[id number] DOB: 04/12/1941 DOA: 04/04/2019  Referring MD/NP/PA: Dr. Sabra Heck. PCP: Crist Infante, MD  Patient coming from: Home  Chief Complaint: Painless bright red blood per rectum; prior history of diverticulosis.  HPI: James Jacobs is a 78 y.o. male with a past medical history significant for type 2 diabetes without complication (currently diet-controlled), hypertension, hypercholesterolemia, history of supraventricular tachycardia and diverticulosis; who presented to the emergency department with complaints of multiple episodes of bright red blood per rectum at home.  Patient expressed no recent changes on his diet; no abdominal pain, no nausea, no vomiting, no chest pain, no shortness of breath, no palpitations, no dizziness, no lightheadedness, no use of NSAIDs or any other acute complaints.  Patient reports that he has been using some narcotics for lower back pain as prescribed by his PCP and neurosurgery and he has stopped then about 1 week prior to hospitalization.  Currently [name] he reported having 7 increase constipation and firmness consistency of his stool, requiring for him to strain.  While waiting in the ED patient was found with a hemoglobin of 14.6, stable vital signs, and normal BUN and creatinine.  CBGs in the 120s range.  Patient had 4 episodes of bloody stools while in the ED.  Patient was typed and screened, IV fluids initiated and [facility] contacted to me patient for further evaluation and management.  Past Medical/Surgical History: Past Medical History:  Diagnosis Date  . Diabetes mellitus without complication ([place])    denies  . Early cataracts, bilateral   . History of nuclear stress test 2013   low risk study  . HNP (herniated nucleus pulposus), lumbar   . Hypercholesteremia   . Hypertension   . Pre-diabetes    per pt ; no medication  . SVT (supraventricular tachycardia) ([place])   . Wears contact lenses     Past  Surgical History:  Procedure Laterality Date  . COLONOSCOPY    . LUMBAR LAMINECTOMY/DECOMPRESSION MICRODISCECTOMY Right 02/13/2019   Procedure: RIGHT LUMBAR FOUR  HEMILAMINOTOMY AND MICRODISCECTOMY;  Surgeon: Jovita Gamma, MD;  Location: [place];  Service: Neurosurgery;  Laterality: Right;  . TONSILLECTOMY      Social History:  reports that he has never smoked. He has never used smokeless tobacco. He reports current alcohol use. He reports that he does not use drugs.  Allergies: No Known Allergies  Family History:  According to patient family history only significant for hypertension and cholesterol.  Otherwise noncontributory.   Prior to Admission medications   Medication Sig Start Date End Date Taking? Authorizing Provider  acetaminophen (TYLENOL) 500 MG tablet Take 1,000 mg by mouth every 6 (six) hours as needed for moderate pain or headache.   Yes [provider]  ALPRAZolam (XANAX) 0.25 MG tablet Take 0.25 mg by mouth daily as needed for anxiety or sleep.    Yes [provider]  ascorbic acid (VITAMIN C) 1000 MG tablet Take 1,000 mg by mouth daily.   Yes [provider]  aspirin 81 MG tablet Take 81 mg by mouth daily.   Yes [provider]  cholecalciferol (VITAMIN D) 1000 UNITS tablet Take 1,000 Units by mouth daily.   Yes [provider]  diltiazem (CARDIZEM CD) 180 MG 24 hr capsule Take 1 capsule (180 mg total) by mouth daily. 11/12/18 04/04/19 Yes Lorretta Harp, MD  ezetimibe (ZETIA) 10 MG tablet Take 10 mg by mouth daily.   Yes [provider]  hydrochlorothiazide (HYDRODIURIL) 25 MG tablet one tablet by mouth daily   Yes [provider]  HYDROcodone-acetaminophen (NORCO/VICODIN) 5-325 MG tablet Take 1-2 tablets by mouth every 4 (four) hours as needed (pain). 02/13/19  Yes Shirlean Kelly, MD  ibandronate (BONIVA) 150 MG tablet Take 150 mg by mouth every 30 (thirty) days. Take in the morning with a full glass of  water, on an empty stomach, and do not take anything else by mouth or lie down for the next 30 min.   Yes [provider]  Multiple Vitamin (MULTIVITAMIN WITH MINERALS) TABS Take 1 tablet by mouth daily.   Yes [provider]  quinapril (ACCUPRIL) 40 MG tablet Take 40 mg by mouth daily.    Yes [provider]  rosuvastatin (CRESTOR) 20 MG tablet Take 5 mg by mouth daily.    Yes [provider]  tadalafil (CIALIS) 20 MG tablet Take 5 mg by mouth daily.   Yes [provider]  metoprolol tartrate (LOPRESSOR) 25 MG tablet Take 12.5 mg by mouth 2 (two) times daily.    [provider]  spironolactone (ALDACTONE) 25 MG tablet Take 1 tablet (25 mg total) by mouth daily. Patient not taking: Reported on 04/04/2019 11/12/18 02/11/19  Runell Gess, MD    Review of Systems:  Negative except as otherwise mentioned in HPI.  Physical Exam: Vitals:   04/04/19 1400 04/04/19 1430 04/04/19 1530 04/04/19 1600  BP: (!) 178/87 (!) 184/86 (!) 207/88 (!) 186/81  Pulse: 79 79 80 80  Resp:      Temp:      TempSrc:      SpO2: 98% 98% 98% 98%  Weight:      Height:        Constitutional: NAD, calm, comfortable Eyes: PERRL, lids and conjunctivae normal ENMT: Mucous membranes are moist. Posterior pharynx clear of any exudate or lesions.Normal dentition.  Neck: normal, supple, no masses, no thyromegaly Respiratory: clear to auscultation bilaterally, no wheezing, no crackles. Normal respiratory effort. No accessory muscle use.  Cardiovascular: Regular rate and rhythm, no murmurs / rubs / gallops. No extremity edema. 2+ pedal pulses. No carotid bruits.  Abdomen: no tenderness, no masses palpated. No hepatosplenomegaly. Bowel sounds positive.  Musculoskeletal: no clubbing / cyanosis. No joint deformity upper and lower extremities. Good ROM, no contractures. Normal muscle tone.  Skin: no rashes, lesions, ulcers. No induration Neurologic: CN 2-12 grossly intact.  Sensation intact, DTR normal. Strength 5/5 in all 4.  Psychiatric: Normal judgment and insight. Alert and oriented x 3. Normal mood.    Labs on Admission: I have personally reviewed the following labs and imaging studies  CBC: Recent Labs  Lab 04/04/19 0803 04/04/19 1349  WBC 7.4 7.7  NEUTROABS 4.6  --   HGB 13.6 12.3*  HCT 42.3 38.5*  MCV 97.0 98.5  PLT 209 202   Basic Metabolic Panel: Recent Labs  Lab 04/04/19 0803  NA 141  K 3.5  CL 109  CO2 24  GLUCOSE 127*  BUN 17  CREATININE 0.83  CALCIUM 9.5   GFR: Estimated Creatinine Clearance: 75.5 mL/min (by C-G formula based on SCr of 0.83 mg/dL).   Liver Function Tests: Recent Labs  Lab 04/04/19 0803  AST 18  ALT 22  ALKPHOS 49  BILITOT 0.7  PROT 6.7  ALBUMIN 3.9   Coagulation Profile: Recent Labs  Lab 04/04/19 0803  INR 1.0   HbA1C: Recent Labs    04/04/19 0803  HGBA1C 5.5   Urine analysis:  Component Value Date/Time   COLORURINE YELLOW 04/27/2018 1342   APPEARANCEUR CLEAR 04/27/2018 1342   LABSPEC 1.009 04/27/2018 1342   PHURINE 6.0 04/27/2018 1342   GLUCOSEU NEGATIVE 04/27/2018 1342   HGBUR NEGATIVE 04/27/2018 1342   BILIRUBINUR NEGATIVE 04/27/2018 1342   KETONESUR 5 (A) 04/27/2018 1342   PROTEINUR NEGATIVE 04/27/2018 1342   NITRITE NEGATIVE 04/27/2018 1342   LEUKOCYTESUR NEGATIVE 04/27/2018 1342    Radiological Exams on Admission: No results found.  EKG: None  Assessment/Plan 1-acute lower GI bleed: In the setting of what appears to be diverticular bleeding. -Patient without abdominal pain, no nausea, no vomiting, no fever. -Bright red blood per rectum/burgundy stools at home and actively while in the ED (up to 4 episodes while waiting for bed assignment). -Hemoglobin stable and patient hemodynamically stable -Type and screen has been done -Continue to provide supportive care and fluid resuscitation -Follow hemoglobin trend -Avoid heparin products and NSAIDs. -GI service has  been contacted and will see patient in consultation. -Daily PPI ordered. -Full liquid diet for bowel rest has been ordered.  2-hypertension/SVT -Will hold aspirin -Continue Cardizem, lisinopril, Aldactone and Lopressor -Follow-up vital signs.  3-hyperlipidemia -We will check lipid panel -Continue Crestor and Zetia.  4-GERD -Continue PPI  5-history of anxiety -Continue as needed Xanax-a stable mood currently.  6-type 2 diabetes mellitus -Currently following use diet control -A1c 5.5.   DVT prophylaxis: SCDs. Code Status: Full code Family Communication: No family at bedside. Disposition Plan: Discharge back home once bleeding has stabilized. Consults called: Gastroenterology service Corinda Gubler([facility]) Admission status: Observation, MedSurg bed; length of stay 8 less than 2 midnights.  At this moment is anticipated that if patient remains stable and his lower GI bleed episode self-limited may be able to be discharged home less than 2 midnights with outpatient follow-up.  If situation changes of his bleeding intensifies requiring blood transfusion, further supplementation and invasive procedures to stabilize bleeding he will require a longer state and is a status can be changed to inpatient at that time.   Time Spent: 60 minutes  Vassie Lollarlos Elliott Quade MD [facility] Pager [phone]   04/04/2019, 5:20 PM

## 2019-04-04 NOTE — ED Notes (Signed)
ED TO INPATIENT HANDOFF REPORT  ED Nurse Name and Phone #: Zuhayr Deeney [phone]  S Name/Age/Gender James Jacobs 78 y.o. male Room/Bed: APA06/APA06  Code Status   Code Status: Prior  Home/SNF/Other Home Patient oriented to: self, place, time and situation Is this baseline? Yes   Triage Complete: Triage complete  Chief Complaint Rectal Bleeding  Triage Note Pt states he had a bm this am and noticed bright red blood in the commode.  Pt denies abd pain or other complaints.  Pt states he has had hemorrhoid issues in the past.   Pt has mild rectal pain   Allergies No Known Allergies  Level of Care/Admitting Diagnosis ED Disposition    ED Disposition Condition Comment   Admit  [facility] [[id number]]  Level of Care: Med-Surg [16]  I expect the patient will be discharged within 24 hours: No (not a candidate for 5C-Observation unit)  Covid Evaluation: Asymptomatic Screening Protocol (No Symptoms)  Diagnosis: Acute lower GI bleeding [454098][238177]  Admitting Physician: Vassie LollMADERA, CARLOS [[id number]]  Attending Physician: Vassie LollMADERA, CARLOS [[id number]]  PT Class (Do Not Modify): Observation [104]  PT Acc Code (Do Not Modify): Observation [10022]       B Medical/Surgery History Past Medical History:  Diagnosis Date  . Diabetes mellitus without complication ([facility])    denies  . Early cataracts, bilateral   . History of nuclear stress test 2013   low risk study  . HNP (herniated nucleus pulposus), lumbar   . Hypercholesteremia   . Hypertension   . Pre-diabetes    per pt ; no medication  . SVT (supraventricular tachycardia) ([facility])   . Wears contact lenses    Past Surgical History:  Procedure Laterality Date  . COLONOSCOPY    . LUMBAR LAMINECTOMY/DECOMPRESSION MICRODISCECTOMY Right 02/13/2019   Procedure: RIGHT LUMBAR FOUR  HEMILAMINOTOMY AND MICRODISCECTOMY;  Surgeon: Shirlean KellyNudelman, Robert, MD;  [facility];  Service: Neurosurgery;  Laterality: Right;  .  TONSILLECTOMY       A IV Location/Drains/Wounds Patient Lines/Drains/Airways Status   Active Line/Drains/Airways    Name:   Placement date:   Placement time:   Site:   Days:   Peripheral IV 02/13/19 Left Hand   02/13/19    0705    Hand   50   Peripheral IV 04/04/19 Right Forearm   04/04/19    0857    Forearm   less than 1   Incision (Closed) 02/13/19 Back Other (Comment)   02/13/19    0847     50          Intake/Output Last 24 hours No intake or output data in the 24 hours ending 04/04/19 1519  Labs/Imaging Results for orders placed or performed during the hospital encounter of 04/04/19 (from the past 48 hour(s))  CBC with Differential/Platelet     Status: None   Collection Time: 04/04/19  8:03 AM  Result Value Ref Range   WBC 7.4 4.0 - 10.5 K/uL   RBC 4.36 4.22 - 5.81 MIL/uL   Hemoglobin 13.6 13.0 - 17.0 g/dL   HCT 11.942.3 14.739.0 - 82.952.0 %   MCV 97.0 80.0 - 100.0 fL   MCH 31.2 26.0 - 34.0 pg   MCHC 32.2 30.0 - 36.0 g/dL   RDW 56.212.4 13.011.5 - 86.515.5 %   Platelets 209 150 - 400 K/uL   nRBC 0.0 0.0 - 0.2 %   Neutrophils Relative % 62 %   Neutro Abs 4.6 1.7 - 7.7 K/uL  Lymphocytes Relative 25 %   Lymphs Abs 1.8 0.7 - 4.0 K/uL   Monocytes Relative 10 %   Monocytes Absolute 0.8 0.1 - 1.0 K/uL   Eosinophils Relative 2 %   Eosinophils Absolute 0.1 0.0 - 0.5 K/uL   Basophils Relative 1 %   Basophils Absolute 0.1 0.0 - 0.1 K/uL   Immature Granulocytes 0 %   Abs Immature Granulocytes 0.02 0.00 - 0.07 K/uL    Comment: Performed at [facility], [address], [place], [place] [address]  Type and screen     Status: None   Collection Time: 04/04/19  8:03 AM  Result Value Ref Range   ABO/RH(D) A POS    Antibody Screen NEG    Sample Expiration      04/07/2019,2359 Performed at [facility], [address], [place], [place] [address]   Protime-INR     Status: None   Collection Time: 04/04/19  8:03 AM  Result Value Ref Range   Prothrombin Time 13.5 11.4 - 15.2 seconds   INR 1.0 0.8 -  1.2    Comment: (NOTE) INR goal varies based on device and disease states. Performed at [facility], [address], [place], [place] [address]   Comprehensive metabolic panel     Status: Abnormal   Collection Time: 04/04/19  8:03 AM  Result Value Ref Range   Sodium 141 135 - 145 mmol/L   Potassium 3.5 3.5 - 5.1 mmol/L   Chloride 109 98 - 111 mmol/L   CO2 24 22 - 32 mmol/L   Glucose, Bld 127 (H) 70 - 99 mg/dL   BUN 17 8 - 23 mg/dL   Creatinine, Ser 0.83 0.61 - 1.24 mg/dL   Calcium 9.5 8.9 - 10.3 mg/dL   Total Protein 6.7 6.5 - 8.1 g/dL   Albumin 3.9 3.5 - 5.0 g/dL   AST 18 15 - 41 U/L   ALT 22 0 - 44 U/L   Alkaline Phosphatase 49 38 - 126 U/L   Total Bilirubin 0.7 0.3 - 1.2 mg/dL   GFR calc non Af Amer >60 >60 mL/min   GFR calc Af Amer >60 >60 mL/min   Anion gap 8 5 - 15    Comment: Performed at [facility], [address] [place], [place] [address]  CBC     Status: Abnormal   Collection Time: 04/04/19  1:49 PM  Result Value Ref Range   WBC 7.7 4.0 - 10.5 K/uL   RBC 3.91 (L) 4.22 - 5.81 MIL/uL   Hemoglobin 12.3 (L) 13.0 - 17.0 g/dL   HCT 38.5 (L) 39.0 - 52.0 %   MCV 98.5 80.0 - 100.0 fL   MCH 31.5 26.0 - 34.0 pg   MCHC 31.9 30.0 - 36.0 g/dL   RDW 12.6 11.5 - 15.5 %   Platelets 202 150 - 400 K/uL   nRBC 0.0 0.0 - 0.2 %    Comment: Performed at [facility], [address], [place], [place] [address]   No results found.  Pending Labs Unresulted Labs (From admission, onward)    Start     Ordered   04/05/19 8185  Basic metabolic panel  Tomorrow morning,   R     04/04/19 1037   04/05/19 0500  Lipid panel  Tomorrow morning,   R     04/04/19 1037   04/04/19 1300  CBC  Now then every 12 hours,   R (with STAT occurrences)     04/04/19 1037   04/04/19  1037  Hemoglobin A1c  Once,   STAT     04/04/19 1037   04/04/19 0736  SARS CORONAVIRUS 2 (TAT 6-24 HRS) Nasopharyngeal Nasopharyngeal Swab  (Asymptomatic/Tier 2 Patients Labs)  Once,   STAT    Question Answer Comment  Is  this test for diagnosis or screening Screening   Symptomatic for COVID-19 as defined by CDC No   Hospitalized for COVID-19 No   Admitted to ICU for COVID-19 No   Previously tested for COVID-19 No   Resident in a congregate (group) care setting No   Employed in healthcare setting No      04/04/19 0735          Vitals/Pain Today's Vitals   04/04/19 1200 04/04/19 1230 04/04/19 1330 04/04/19 1400  BP: (!) 206/105 (!) 189/94 (!) 174/87 (!) 178/87  Pulse: 83 76 79 79  Resp:      Temp:      TempSrc:      SpO2: 99% 98% 98% 98%  Weight:      Height:      PainSc:        Isolation Precautions No active isolations  Medications Medications  0.9 %  sodium chloride infusion ( Intravenous New Bag/Given 04/04/19 1311)  acetaminophen (TYLENOL) tablet 1,000 mg (has no administration in time range)  ALPRAZolam (XANAX) tablet 0.25 mg (has no administration in time range)  diltiazem (CARDIZEM CD) 24 hr capsule 180 mg (180 mg Oral Given 04/04/19 1400)  ezetimibe (ZETIA) tablet 10 mg (10 mg Oral Given 04/04/19 1400)  rosuvastatin (CRESTOR) tablet 20 mg (20 mg Oral Given 04/04/19 1359)  spironolactone (ALDACTONE) tablet 12.5 mg (12.5 mg Oral Given 04/04/19 1400)  lisinopril (ZESTRIL) tablet 40 mg (has no administration in time range)  metoprolol tartrate (LOPRESSOR) tablet 12.5 mg (has no administration in time range)  pantoprazole (PROTONIX) EC tablet 40 mg (40 mg Oral Given 04/04/19 0824)    Mobility walks     Focused Assessments    R Recommendations: See Admitting Provider Note  Report given to:   Additional Notes:

## 2019-04-04 NOTE — Plan of Care (Signed)
  Problem: Activity: Goal: Risk for activity intolerance will decrease Outcome: Progressing   

## 2019-04-05 ENCOUNTER — Other Ambulatory Visit: Payer: Self-pay

## 2019-04-05 DIAGNOSIS — F419 Anxiety disorder, unspecified: Secondary | ICD-10-CM | POA: Diagnosis present

## 2019-04-05 DIAGNOSIS — S3669XA Other injury of rectum, initial encounter: Secondary | ICD-10-CM | POA: Diagnosis not present

## 2019-04-05 DIAGNOSIS — Z8719 Personal history of other diseases of the digestive system: Secondary | ICD-10-CM

## 2019-04-05 DIAGNOSIS — Z20828 Contact with and (suspected) exposure to other viral communicable diseases: Secondary | ICD-10-CM | POA: Diagnosis not present

## 2019-04-05 DIAGNOSIS — I471 Supraventricular tachycardia: Secondary | ICD-10-CM | POA: Diagnosis not present

## 2019-04-05 DIAGNOSIS — Z79899 Other long term (current) drug therapy: Secondary | ICD-10-CM | POA: Diagnosis not present

## 2019-04-05 DIAGNOSIS — I10 Essential (primary) hypertension: Secondary | ICD-10-CM | POA: Diagnosis not present

## 2019-04-05 DIAGNOSIS — K922 Gastrointestinal hemorrhage, unspecified: Secondary | ICD-10-CM | POA: Diagnosis not present

## 2019-04-05 DIAGNOSIS — K644 Residual hemorrhoidal skin tags: Secondary | ICD-10-CM | POA: Diagnosis not present

## 2019-04-05 DIAGNOSIS — K219 Gastro-esophageal reflux disease without esophagitis: Secondary | ICD-10-CM | POA: Diagnosis not present

## 2019-04-05 DIAGNOSIS — K641 Second degree hemorrhoids: Secondary | ICD-10-CM | POA: Diagnosis not present

## 2019-04-05 DIAGNOSIS — Z8249 Family history of ischemic heart disease and other diseases of the circulatory system: Secondary | ICD-10-CM | POA: Diagnosis not present

## 2019-04-05 DIAGNOSIS — Z79891 Long term (current) use of opiate analgesic: Secondary | ICD-10-CM | POA: Diagnosis not present

## 2019-04-05 DIAGNOSIS — K5731 Diverticulosis of large intestine without perforation or abscess with bleeding: Secondary | ICD-10-CM | POA: Diagnosis not present

## 2019-04-05 DIAGNOSIS — D62 Acute posthemorrhagic anemia: Secondary | ICD-10-CM | POA: Diagnosis not present

## 2019-04-05 DIAGNOSIS — E7800 Pure hypercholesterolemia, unspecified: Secondary | ICD-10-CM | POA: Diagnosis not present

## 2019-04-05 DIAGNOSIS — Z7982 Long term (current) use of aspirin: Secondary | ICD-10-CM | POA: Diagnosis not present

## 2019-04-05 DIAGNOSIS — E785 Hyperlipidemia, unspecified: Secondary | ICD-10-CM | POA: Diagnosis not present

## 2019-04-05 DIAGNOSIS — M5126 Other intervertebral disc displacement, lumbar region: Secondary | ICD-10-CM | POA: Diagnosis not present

## 2019-04-05 DIAGNOSIS — K625 Hemorrhage of anus and rectum: Secondary | ICD-10-CM | POA: Diagnosis present

## 2019-04-05 DIAGNOSIS — E119 Type 2 diabetes mellitus without complications: Secondary | ICD-10-CM | POA: Diagnosis not present

## 2019-04-05 LAB — CBC
HCT: 35.9 % — ABNORMAL LOW (ref 39.0–52.0)
Hemoglobin: 11.6 g/dL — ABNORMAL LOW (ref 13.0–17.0)
MCH: 31.6 pg (ref 26.0–34.0)
MCHC: 32.3 g/dL (ref 30.0–36.0)
MCV: 97.8 fL (ref 80.0–100.0)
Platelets: 218 10*3/uL (ref 150–400)
RBC: 3.67 MIL/uL — ABNORMAL LOW (ref 4.22–5.81)
RDW: 12.6 % (ref 11.5–15.5)
WBC: 7.4 10*3/uL (ref 4.0–10.5)
nRBC: 0 % (ref 0.0–0.2)

## 2019-04-05 MED ORDER — PEG-KCL-NACL-NASULF-NA ASC-C 100 G PO SOLR
0.5000 | Freq: Once | ORAL | Status: AC
  Administered 2019-04-05: 100 g via ORAL
  Filled 2019-04-05: qty 1

## 2019-04-05 MED ORDER — PEG-KCL-NACL-NASULF-NA ASC-C 100 G PO SOLR: 1.0000 | Freq: Once | ORAL | Status: DC

## 2019-04-05 NOTE — Consult Note (Signed)
Referring Provider: No ref. provider found Primary Care Physician:  Crist Infante, MD Primary Gastroenterologist:  Dr.John Henrene Pastor  Reason for Consultation:  Hematochezia   HPI: James Jacobs is a 78 y.o. male with a past medical history of anxiety, diabetes mellitus type 2, hypertension, SVT, hypercholesterolemia and diverticulosis. S/P right L4 hemilaminectomy and microdiscectomy 02/13/2019.  He passed a normal solid bowel movement yesterday around 4 PM and noticed a moderate amount of bright red blood in the toilet water and on the toilet tissue.  He presented to [facility] emergency room.  While he was in the ER, he reported having 4-5 episodes of passing mostly blood per the rectum.  No abdominal or rectal pain.  His hemoglobin was 13.6.  Reported having rectal bleeding which she attributed to having hemorrhoids in 2019 while he was in [place].  He was seen by a gastroenterologist wanted to have his hemorrhoids banded.  The gastroenterologist completed a colonoscopy prior to hemorrhoid banding, the patient stated his colonoscopy showed diverticulosis, no polyps.  No known family history of colorectal cancer.  He takes aspirin 81 mg once daily.  Diclofenac 75 mg 1 tab for just a few days after his laminectomy surgery 02/13/2019.  No other NSAID use.  ED course: Sodium 141.  Potassium 3.5.  BUN 17.  Creatinine 0.83.  Alk phos 49.  Albumin 3.9.  AST 18.  ALT 22.  Total bili 0.7.  WBC 7.4.  Hemoglobin 13.6.  Hematocrit 42.3.  MCV 97.0.  Platelet 209.  PT 13.5.  INR 1.0.  SARS coronavirus 2 Negative.   Labs 02/13/2019: Hemoglobin 13.9.  Hematocrit 42.1  Colonoscopy in 2019 done in [place]: showed diverticulosis per the patient's report  Colonoscopy 10/04/2006 by Dr. Scarlette Shorts: Left-sided diverticulosis otherwise normal Recall colonoscopy in 10 years recommended  Flexible sigmoidoscopy 11/24/1998:  Diverticulosis   Past Medical History:  Diagnosis Date  . Diabetes mellitus without  complication ([place])    denies  . Early cataracts, bilateral   . History of nuclear stress test 2013   low risk study  . HNP (herniated nucleus pulposus), lumbar   . Hypercholesteremia   . Hypertension   . Pre-diabetes    per pt ; no medication  . SVT (supraventricular tachycardia) ([place])   . Wears contact lenses     Past Surgical History:  Procedure Laterality Date  . COLONOSCOPY    . LUMBAR LAMINECTOMY/DECOMPRESSION MICRODISCECTOMY Right 02/13/2019   Procedure: RIGHT LUMBAR FOUR  HEMILAMINOTOMY AND MICRODISCECTOMY;  Surgeon: Jovita Gamma, MD;  Location: [facility];  Service: Neurosurgery;  Laterality: Right;  . TONSILLECTOMY      Prior to Admission medications   Medication Sig Start Date End Date Taking? Authorizing Provider  acetaminophen (TYLENOL) 500 MG tablet Take 1,000 mg by mouth every 6 (six) hours as needed for moderate pain or headache.   Yes [provider]  ALPRAZolam (XANAX) 0.25 MG tablet Take 0.25 mg by mouth daily as needed for anxiety or sleep.    Yes [provider]  ascorbic acid (VITAMIN C) 1000 MG tablet Take 1,000 mg by mouth daily.   Yes [provider]  aspirin 81 MG tablet Take 81 mg by mouth daily.   Yes [provider]  cholecalciferol (VITAMIN D) 1000 UNITS tablet Take 1,000 Units by mouth daily.   Yes [provider]  diltiazem (CARDIZEM CD) 180 MG 24 hr capsule Take 1 capsule (180 mg total) by mouth daily. 11/12/18 04/04/19 Yes Lorretta Harp,  MD  ezetimibe (ZETIA) 10 MG tablet Take 10 mg by mouth daily.   Yes [provider]  hydrochlorothiazide (HYDRODIURIL) 25 MG tablet one tablet by mouth daily   Yes [provider]  HYDROcodone-acetaminophen (NORCO/VICODIN) 5-325 MG tablet Take 1-2 tablets by mouth every 4 (four) hours as needed (pain). 02/13/19  Yes Jovita Gamma, MD  ibandronate (BONIVA) 150 MG tablet Take 150 mg by mouth every 30 (thirty) days. Take in the morning with a full glass of  water, on an empty stomach, and do not take anything else by mouth or lie down for the next 30 min.   Yes [provider]  Multiple Vitamin (MULTIVITAMIN WITH MINERALS) TABS Take 1 tablet by mouth daily.   Yes [provider]  quinapril (ACCUPRIL) 40 MG tablet Take 40 mg by mouth daily.    Yes [provider]  rosuvastatin (CRESTOR) 20 MG tablet Take 5 mg by mouth daily.    Yes [provider]  tadalafil (CIALIS) 20 MG tablet Take 5 mg by mouth daily.   Yes [provider]  metoprolol tartrate (LOPRESSOR) 25 MG tablet Take 12.5 mg by mouth 2 (two) times daily.    [provider]  spironolactone (ALDACTONE) 25 MG tablet Take 1 tablet (25 mg total) by mouth daily. Patient not taking: Reported on 04/04/2019 11/12/18 02/11/19  Lorretta Harp, MD    Current Facility-Administered Medications  Medication Dose Route Frequency Provider Last Rate Last Dose  . 0.9 %  sodium chloride infusion   Intravenous Continuous Barton Dubois, MD 100 mL/hr at 04/05/19 1349    . acetaminophen (TYLENOL) tablet 1,000 mg  1,000 mg Oral Q6H PRN Barton Dubois, MD   1,000 mg at 04/04/19 2132  . ALPRAZolam Duanne Moron) tablet 0.25 mg  0.25 mg Oral Daily PRN Barton Dubois, MD      . diltiazem (CARDIZEM CD) 24 hr capsule 180 mg  180 mg Oral Daily Barton Dubois, MD   180 mg at 04/05/19 1015  . ezetimibe (ZETIA) tablet 10 mg  10 mg Oral Daily Barton Dubois, MD   10 mg at 04/05/19 1015  . hydrALAZINE (APRESOLINE) injection 10 mg  10 mg Intravenous Q8H PRN Barton Dubois, MD   10 mg at 04/04/19 1746  . lisinopril (ZESTRIL) tablet 40 mg  40 mg Oral Daily Barton Dubois, MD   40 mg at 04/05/19 1015  . metoprolol tartrate (LOPRESSOR) tablet 12.5 mg  12.5 mg Oral BID Barton Dubois, MD   12.5 mg at 04/05/19 1015  . pantoprazole (PROTONIX) EC tablet 40 mg  40 mg Oral Daily Barton Dubois, MD   40 mg at 04/05/19 1015  . rosuvastatin (CRESTOR) tablet 20 mg  20 mg Oral Daily Barton Dubois, MD   20 mg at 04/05/19 1015  . spironolactone (ALDACTONE) tablet 12.5 mg  12.5 mg Oral Daily Barton Dubois, MD   12.5 mg at 04/05/19 1147    Allergies as of 04/04/2019  . (No Known Allergies)    No family history on file.  Social History   Socioeconomic History  . Marital status: Married    Spouse name: Not on file  . Number of children: Not on file  . Years of education: Not on file  . Highest education level: Not on file  Occupational History  . Not on file  Social Needs  . Financial resource strain: Not on file  . Food insecurity    Worry: Not on file    Inability: Not  on file  . Transportation needs    Medical: Not on file    Non-medical: Not on file  Tobacco Use  . Smoking status: Never Smoker  . Smokeless tobacco: Never Used  Substance and Sexual Activity  . Alcohol use: Yes    Comment: occasional glass of wine  . Drug use: Never  . Sexual activity: Yes  Lifestyle  . Physical activity    Days per week: Not on file    Minutes per session: Not on file  . Stress: Not on file  Relationships  . Social Herbalist on phone: Not on file    Gets together: Not on file    Attends religious service: Not on file    Active member of club or organization: Not on file    Attends meetings of clubs or organizations: Not on file    Relationship status: Not on file  . Intimate partner violence    Fear of current or ex partner: Not on file    Emotionally abused: Not on file    Physically abused: Not on file    Forced sexual activity: Not on file  Other Topics Concern  . Not on file  Social History Narrative  . Not on file    Review of Systems: Gen: Denies fever, sweats or chills. No weight loss.  CV: Denies chest pain, palpitations or edema. Resp: Denies cough, shortness of breath of hemoptysis.  GI: See HPI.  He denies having any dysphagia, heartburn or stomach pain. GU : Denies urinary burning, blood in urine, increased urinary frequency or  incontinence. MS: Denies joint pain, muscles aches or weakness. Derm: Denies rash, itchiness, skin lesions or unhealing ulcers. Psych: Denies depression, anxiety, memory loss, suicidal ideation and confusion. Heme: Denies bruising, bleeding. Neuro:  Denies headaches, dizziness or paresthesias. Endo:  Denies any problems with DM, thyroid or adrenal function.  Physical Exam: Vital signs in last 24 hours: Temp:  [97.7 F (36.5 C)-98.3 F (36.8 C)] 98.2 F (36.8 C) (10/31 0850) Pulse Rate:  [67-80] 79 (10/31 0850) Resp:  [18] 18 (10/31 0850) BP: (145-207)/(65-93) 145/65 (10/31 0850) SpO2:  [94 %-98 %] 94 % (10/31 0850) Last BM Date: 04/04/19 General:   Alert,  well-developed, well-nourished, pleasant and cooperative in NAD. Head:  Normocephalic and atraumatic. Eyes:  Sclera clear, no icterus. Conjunctiva pink. Ears:  Normal auditory acuity. Nose:  No deformity, discharge or lesions. Mouth:  No deformity or lesions.   Neck:  Supple. Lungs: Sounds clear throughout. Heart: Distant S1, S2 without murmurs, regular rhythm. Abdomen: Soft, nondistended, nontender, positive bowel sounds all 4 quadrants, no HSM. Rectal: Scant amount of dark red blood on the exam glove, moderately inflamed internal hemorrhoids palpated, and large prostate. Msk:  Symmetrical without gross deformities. . Pulses:  Normal pulses noted. Extremities:  Without clubbing or edema. Neurologic:  Alert and  oriented x4;  grossly normal neurologically. Skin:  Intact without significant lesions or rashes.. Psych:  Alert and cooperative. Normal mood and affect.  Intake/Output from previous day: 10/30 0701 - 10/31 0700 In: 1093.3 [P.O.:340; I.V.:753.3] Out: 250 [Urine:250] Intake/Output this shift: Total I/O In: 450 [P.O.:450] Out: 500 [Urine:500]  Lab Results: Recent Labs    04/04/19 0803 04/04/19 1349 04/05/19 1218  WBC 7.4 7.7 7.4  HGB 13.6 12.3* 11.6*  HCT 42.3 38.5* 35.9*  PLT 209 202 218   BMET  Recent Labs    04/04/19 0803  NA 141  K 3.5  CL 109  CO2 24  GLUCOSE 127*  BUN 17  CREATININE 0.83  CALCIUM 9.5   LFT Recent Labs    04/04/19 0803  PROT 6.7  ALBUMIN 3.9  AST 18  ALT 22  ALKPHOS 49  BILITOT 0.7   PT/INR Recent Labs    04/04/19 0803  LABPROT 13.5  INR 1.0   Hepatitis Panel No results for input(s): HEPBSAG, HCVAB, HEPAIGM, HEPBIGM in the last 72 hours.  Studies/Results: No results found.  IMPRESSION/PLAN:  42.  78 year old male with painless hematochezia, most likely diverticular bleed.  Hg drifting downward 11.6 >>12.3>>13.6 -If patient develops worsening active bleeding an abdominal/pelvic CT angiogram will be ordered -Plan for colonoscopy tomorrow Dr. Rush Landmark.  Colonoscopy benefits and risk discussed including the risk with sedation, risk of bleeding, infection and perforation. -repeat CBC and BMP in am  -Continue Protonix 40 mg once daily. -Type and cross completed if not already done  2.  Hypertension  3.  History of SVT  Further recommendations per Dr. Corena Pilgrim Dorathy Daft  04/05/2019, 2:24 PM

## 2019-04-05 NOTE — Care Management Obs Status (Signed)
[facility] NOTIFICATION   Patient Details  Name: James Jacobs MRN: [id number] Date of Birth: 1940/06/13   Medicare Observation Status Notification Given:  Yes    Carles Collet, RN 04/05/2019, 1:09 PM

## 2019-04-05 NOTE — H&P (View-Only) (Signed)
Referring Provider: No ref. provider found Primary Care Physician:  James Infante, MD Primary Gastroenterologist:  Dr.John Henrene Pastor  Reason for Consultation:  Hematochezia   HPI: James Jacobs James Jacobs is a 78 y.o. male with a past medical history of anxiety, diabetes mellitus type 2, hypertension, SVT, hypercholesterolemia and diverticulosis. S/P right L4 hemilaminectomy and microdiscectomy 02/13/2019.  He passed a normal solid bowel movement yesterday around 4 PM and noticed a moderate amount of bright red blood in the toilet water and on the toilet tissue.  He presented to [facility] emergency room.  While he was in the ER, he reported having 4-5 episodes of passing mostly blood per the rectum.  No abdominal or rectal pain.  His hemoglobin was 13.6.  Reported having rectal bleeding which she attributed to having hemorrhoids in 2019 while he was in [place].  He was seen by a gastroenterologist wanted to have his hemorrhoids banded.  The gastroenterologist completed a colonoscopy prior to hemorrhoid banding, the patient stated his colonoscopy showed diverticulosis, no polyps.  No known family history of colorectal cancer.  He takes aspirin 81 mg once daily.  Diclofenac 75 mg 1 tab for just a few days after his laminectomy surgery 02/13/2019.  No other NSAID use.  ED course: Sodium 141.  Potassium 3.5.  BUN 17.  Creatinine 0.83.  Alk phos 49.  Albumin 3.9.  AST 18.  ALT 22.  Total bili 0.7.  WBC 7.4.  Hemoglobin 13.6.  Hematocrit 42.3.  MCV 97.0.  Platelet 209.  PT 13.5.  INR 1.0.  SARS coronavirus 2 Negative.   Labs 02/13/2019: Hemoglobin 13.9.  Hematocrit 42.1  Colonoscopy in 2019 done in [place]: showed diverticulosis per the patient's report  Colonoscopy 10/04/2006 by Dr. Scarlette Jacobs: Left-sided diverticulosis otherwise normal Recall colonoscopy in 10 years recommended  Flexible sigmoidoscopy 11/24/1998:  Diverticulosis   Past Medical History:  Diagnosis Date  . Diabetes mellitus without  complication ([place])    denies  . Early cataracts, bilateral   . History of nuclear stress test 2013   low risk study  . HNP (herniated nucleus pulposus), lumbar   . Hypercholesteremia   . Hypertension   . Pre-diabetes    per pt ; no medication  . SVT (supraventricular tachycardia) ([place])   . Wears contact lenses     Past Surgical History:  Procedure Laterality Date  . COLONOSCOPY    . LUMBAR LAMINECTOMY/DECOMPRESSION MICRODISCECTOMY Right 02/13/2019   Procedure: RIGHT LUMBAR FOUR  HEMILAMINOTOMY AND MICRODISCECTOMY;  Surgeon: Jovita Gamma, MD;  Location: [facility];  Service: Neurosurgery;  Laterality: Right;  . TONSILLECTOMY      Prior to Admission medications   Medication Sig Start Date End Date Taking? Authorizing Provider  acetaminophen (TYLENOL) 500 MG tablet Take 1,000 mg by mouth every 6 (six) hours as needed for moderate pain or headache.   Yes [provider]  ALPRAZolam (XANAX) 0.25 MG tablet Take 0.25 mg by mouth daily as needed for anxiety or sleep.    Yes [provider]  ascorbic acid (VITAMIN C) 1000 MG tablet Take 1,000 mg by mouth daily.   Yes [provider]  aspirin 81 MG tablet Take 81 mg by mouth daily.   Yes [provider]  cholecalciferol (VITAMIN D) 1000 UNITS tablet Take 1,000 Units by mouth daily.   Yes [provider]  diltiazem (CARDIZEM CD) 180 MG 24 hr capsule Take 1 capsule (180 mg total) by mouth daily. 11/12/18 04/04/19 Yes Lorretta Harp,  MD  ezetimibe (ZETIA) 10 MG tablet Take 10 mg by mouth daily.   Yes [provider]  hydrochlorothiazide (HYDRODIURIL) 25 MG tablet one tablet by mouth daily   Yes [provider]  HYDROcodone-acetaminophen (NORCO/VICODIN) 5-325 MG tablet Take 1-2 tablets by mouth every 4 (four) hours as needed (pain). 02/13/19  Yes Jovita Gamma, MD  ibandronate (BONIVA) 150 MG tablet Take 150 mg by mouth every 30 (thirty) days. Take in the morning with a full glass of  water, on an empty stomach, and do not take anything else by mouth or lie down for the next 30 min.   Yes [provider]  Multiple Vitamin (MULTIVITAMIN WITH MINERALS) TABS Take 1 tablet by mouth daily.   Yes [provider]  quinapril (ACCUPRIL) 40 MG tablet Take 40 mg by mouth daily.    Yes [provider]  rosuvastatin (CRESTOR) 20 MG tablet Take 5 mg by mouth daily.    Yes [provider]  tadalafil (CIALIS) 20 MG tablet Take 5 mg by mouth daily.   Yes [provider]  metoprolol tartrate (LOPRESSOR) 25 MG tablet Take 12.5 mg by mouth 2 (two) times daily.    [provider]  spironolactone (ALDACTONE) 25 MG tablet Take 1 tablet (25 mg total) by mouth daily. Patient not taking: Reported on 04/04/2019 11/12/18 02/11/19  Lorretta Harp, MD    Current Facility-Administered Medications  Medication Dose Route Frequency Provider Last Rate Last Dose  . 0.9 %  sodium chloride infusion   Intravenous Continuous Barton Dubois, MD 100 mL/hr at 04/05/19 1349    . acetaminophen (TYLENOL) tablet 1,000 mg  1,000 mg Oral Q6H PRN Barton Dubois, MD   1,000 mg at 04/04/19 2132  . ALPRAZolam Duanne Moron) tablet 0.25 mg  0.25 mg Oral Daily PRN Barton Dubois, MD      . diltiazem (CARDIZEM CD) 24 hr capsule 180 mg  180 mg Oral Daily Barton Dubois, MD   180 mg at 04/05/19 1015  . ezetimibe (ZETIA) tablet 10 mg  10 mg Oral Daily Barton Dubois, MD   10 mg at 04/05/19 1015  . hydrALAZINE (APRESOLINE) injection 10 mg  10 mg Intravenous Q8H PRN Barton Dubois, MD   10 mg at 04/04/19 1746  . lisinopril (ZESTRIL) tablet 40 mg  40 mg Oral Daily Barton Dubois, MD   40 mg at 04/05/19 1015  . metoprolol tartrate (LOPRESSOR) tablet 12.5 mg  12.5 mg Oral BID Barton Dubois, MD   12.5 mg at 04/05/19 1015  . pantoprazole (PROTONIX) EC tablet 40 mg  40 mg Oral Daily Barton Dubois, MD   40 mg at 04/05/19 1015  . rosuvastatin (CRESTOR) tablet 20 mg  20 mg Oral Daily Barton Dubois, MD   20 mg at 04/05/19 1015  . spironolactone (ALDACTONE) tablet 12.5 mg  12.5 mg Oral Daily Barton Dubois, MD   12.5 mg at 04/05/19 1147    Allergies as of 04/04/2019  . (No Known Allergies)    No family history on file.  Social History   Socioeconomic History  . Marital status: Married    Spouse name: Not on file  . Number of children: Not on file  . Years of education: Not on file  . Highest education level: Not on file  Occupational History  . Not on file  Social Needs  . Financial resource strain: Not on file  . Food insecurity    Worry: Not on file    Inability: Not  on file  . Transportation needs    Medical: Not on file    Non-medical: Not on file  Tobacco Use  . Smoking status: Never Smoker  . Smokeless tobacco: Never Used  Substance and Sexual Activity  . Alcohol use: Yes    Comment: occasional glass of wine  . Drug use: Never  . Sexual activity: Yes  Lifestyle  . Physical activity    Days per week: Not on file    Minutes per session: Not on file  . Stress: Not on file  Relationships  . Social Herbalist on phone: Not on file    Gets together: Not on file    Attends religious service: Not on file    Active member of club or organization: Not on file    Attends meetings of clubs or organizations: Not on file    Relationship status: Not on file  . Intimate partner violence    Fear of current or ex partner: Not on file    Emotionally abused: Not on file    Physically abused: Not on file    Forced sexual activity: Not on file  Other Topics Concern  . Not on file  Social History Narrative  . Not on file    Review of Systems: Gen: Denies fever, sweats or chills. No weight loss.  CV: Denies chest pain, palpitations or edema. Resp: Denies cough, shortness of breath of hemoptysis.  GI: See HPI.  He denies having any dysphagia, heartburn or stomach pain. GU : Denies urinary burning, blood in urine, increased urinary frequency or  incontinence. MS: Denies joint pain, muscles aches or weakness. Derm: Denies rash, itchiness, skin lesions or unhealing ulcers. Psych: Denies depression, anxiety, memory loss, suicidal ideation and confusion. Heme: Denies bruising, bleeding. Neuro:  Denies headaches, dizziness or paresthesias. Endo:  Denies any problems with DM, thyroid or adrenal function.  Physical Exam: Vital signs in last 24 hours: Temp:  [97.7 F (36.5 C)-98.3 F (36.8 C)] 98.2 F (36.8 C) (10/31 0850) Pulse Rate:  [67-80] 79 (10/31 0850) Resp:  [18] 18 (10/31 0850) BP: (145-207)/(65-93) 145/65 (10/31 0850) SpO2:  [94 %-98 %] 94 % (10/31 0850) Last BM Date: 04/04/19 General:   Alert,  well-developed, well-nourished, pleasant and cooperative in NAD. Head:  Normocephalic and atraumatic. Eyes:  Sclera clear, no icterus. Conjunctiva pink. Ears:  Normal auditory acuity. Nose:  No deformity, discharge or lesions. Mouth:  No deformity or lesions.   Neck:  Supple. Lungs: Sounds clear throughout. Heart: Distant S1, S2 without murmurs, regular rhythm. Abdomen: Soft, nondistended, nontender, positive bowel sounds all 4 quadrants, no HSM. Rectal: Scant amount of dark red blood on the exam glove, moderately inflamed internal hemorrhoids palpated, and large prostate. Msk:  Symmetrical without gross deformities. . Pulses:  Normal pulses noted. Extremities:  Without clubbing or edema. Neurologic:  Alert and  oriented x4;  grossly normal neurologically. Skin:  Intact without significant lesions or rashes.. Psych:  Alert and cooperative. Normal mood and affect.  Intake/Output from previous day: 10/30 0701 - 10/31 0700 In: 1093.3 [P.O.:340; I.V.:753.3] Out: 250 [Urine:250] Intake/Output this shift: Total I/O In: 450 [P.O.:450] Out: 500 [Urine:500]  Lab Results: Recent Labs    04/04/19 0803 04/04/19 1349 04/05/19 1218  WBC 7.4 7.7 7.4  HGB 13.6 12.3* 11.6*  HCT 42.3 38.5* 35.9*  PLT 209 202 218   BMET  Recent Labs    04/04/19 0803  NA 141  K 3.5  CL 109  CO2 24  GLUCOSE 127*  BUN 17  CREATININE 0.83  CALCIUM 9.5   LFT Recent Labs    04/04/19 0803  PROT 6.7  ALBUMIN 3.9  AST 18  ALT 22  ALKPHOS 49  BILITOT 0.7   PT/INR Recent Labs    04/04/19 0803  LABPROT 13.5  INR 1.0   Hepatitis Panel No results for input(s): HEPBSAG, HCVAB, HEPAIGM, HEPBIGM in the last 72 hours.  Studies/Results: No results found.  IMPRESSION/PLAN:  42.  78 year old male with painless hematochezia, most likely diverticular bleed.  Hg drifting downward 11.6 >>12.3>>13.6 -If patient develops worsening active bleeding an abdominal/pelvic CT angiogram will be ordered -Plan for colonoscopy tomorrow Dr. Rush Landmark.  Colonoscopy benefits and risk discussed including the risk with sedation, risk of bleeding, infection and perforation. -repeat CBC and BMP in am  -Continue Protonix 40 mg once daily. -Type and cross completed if not already done  2.  Hypertension  3.  History of SVT  Further recommendations per Dr. Corena Pilgrim Dorathy Daft  04/05/2019, 2:24 PM

## 2019-04-05 NOTE — Progress Notes (Signed)
PROGRESS NOTE    Torie Priebe  AOZ:[id number] DOB: 20-Nov-1940 DOA: 04/04/2019 PCP: Rodrigo Ran, MD   Brief Narrative:  James Jacobs is a 78 y.o. male with a past medical history significant for type 2 diabetes without complication (currently diet-controlled), hypertension, hypercholesterolemia, history of supraventricular tachycardia and diverticulosis; who presented to the [facility] emergency department with complaints of multiple episodes of bright red blood per rectum at home.  Patient expressed no recent changes on his diet; no abdominal pain, no nausea, no vomiting, no chest pain, no shortness of breath, no palpitations, no dizziness, no lightheadedness, no use of NSAIDs or any other acute complaints. While waiting in the ED patient had 4 episodes of bloody stools and he was found with a hemoglobin of 14.6, stable vital signs, and normal BUN and creatinine.  CBGs in the 120s range.   Patient was admitted by [facility] at [facility] in order to obtain GI services.  Assessment & Plan:   Principal Problem:   Acute lower GI bleeding Active Problems:   SVT (supraventricular tachycardia) (HCC)   Essential hypertension  Bright red blood per rectum/acute lower GI bleed/acute blood loss anemia: Patient carries a history of diverticulosis and internal as well as external hemorrhoids in the past.  Hemoglobin has dropped to 11.6.  No indication of transfusion.  Will recheck CBC later today and in the morning.  Spoke to GI and they are going to see him however they told me that they plan to do colonoscopy in the morning.  Will defer management to them.  Essential hypertension/history of SVT: Controlled.  Continue Cardizem, lisinopril, Lopressor and Aldactone.  Hyperlipidemia: Continue Crestor and Zetia  GERD: Continue PPI  Type 2 diabetes mellitus: This is a history for him but his hemoglobin A1c is only 5.5.  He is not on any medications.  DVT prophylaxis: SCD Code Status: Full code Family  Communication:  None present at bedside.  Plan of care discussed with patient in length and he verbalized understanding and agreed with it. Disposition Plan: TBD  Estimated body mass index is 28.59 kg/m as calculated from the following:   Height as of this encounter: 5\' 8"  (1.727 m).   Weight as of this encounter: 85.3 kg.      Nutritional status:               Consultants:   GI  Procedures:   None  Antimicrobials:   None   Subjective: Seen and examined.  Feels better but he has had 4 more bloody bowel movements since he arrived in [place] last night.  Objective: Vitals:   04/04/19 1848 04/04/19 2042 04/05/19 0425 04/05/19 0850  BP: (!) 167/71 (!) 153/76 (!) 154/70 (!) 145/65  Pulse: 68 69 67 79  Resp:  18 18 18   Temp:  98.3 F (36.8 C) 97.7 F (36.5 C) 98.2 F (36.8 C)  TempSrc:  Oral Oral Oral  SpO2:  95% 96% 94%  Weight:      Height:        Intake/Output Summary (Last 24 hours) at 04/05/2019 1439 Last data filed at 04/05/2019 1351 Gross per 24 hour  Intake 1543.33 ml  Output 750 ml  Net 793.33 ml   Filed Weights   04/04/19 0630  Weight: 85.3 kg    Examination:  General exam: Appears calm and comfortable  Respiratory system: Clear to auscultation. Respiratory effort normal. Cardiovascular system: S1 & S2 heard, RRR. No JVD, murmurs, rubs, gallops or  clicks. No pedal edema. Gastrointestinal system: Abdomen is nondistended, soft and nontender. No organomegaly or masses felt. Normal bowel sounds heard. Central nervous system: Alert and oriented. No focal neurological deficits. Extremities: Symmetric 5 x 5 power. Skin: No rashes, lesions or ulcers Psychiatry: Judgement and insight appear normal. Mood & affect appropriate.    Data Reviewed: I have personally reviewed following labs and imaging studies  CBC: Recent Labs  Lab 04/04/19 0803 04/04/19 1349 04/05/19 1218  WBC 7.4 7.7 7.4  NEUTROABS 4.6  --   --   HGB 13.6 12.3* 11.6*   HCT 42.3 38.5* 35.9*  MCV 97.0 98.5 97.8  PLT 209 202 009   Basic Metabolic Panel: Recent Labs  Lab 04/04/19 0803  NA 141  K 3.5  CL 109  CO2 24  GLUCOSE 127*  BUN 17  CREATININE 0.83  CALCIUM 9.5   GFR: Estimated Creatinine Clearance: 75.5 mL/min (by C-G formula based on SCr of 0.83 mg/dL). Liver Function Tests: Recent Labs  Lab 04/04/19 0803  AST 18  ALT 22  ALKPHOS 49  BILITOT 0.7  PROT 6.7  ALBUMIN 3.9   No results for input(s): LIPASE, AMYLASE in the last 168 hours. No results for input(s): AMMONIA in the last 168 hours. Coagulation Profile: Recent Labs  Lab 04/04/19 0803  INR 1.0   Cardiac Enzymes: No results for input(s): CKTOTAL, CKMB, CKMBINDEX, TROPONINI in the last 168 hours. BNP (last 3 results) No results for input(s): PROBNP in the last 8760 hours. HbA1C: Recent Labs    04/04/19 0803  HGBA1C 5.5   CBG: No results for input(s): GLUCAP in the last 168 hours. Lipid Profile: No results for input(s): CHOL, HDL, LDLCALC, TRIG, CHOLHDL, LDLDIRECT in the last 72 hours. Thyroid Function Tests: No results for input(s): TSH, T4TOTAL, FREET4, T3FREE, THYROIDAB in the last 72 hours. Anemia Panel: No results for input(s): VITAMINB12, FOLATE, FERRITIN, TIBC, IRON, RETICCTPCT in the last 72 hours. Sepsis Labs: No results for input(s): PROCALCITON, LATICACIDVEN in the last 168 hours.  Recent Results (from the past 240 hour(s))  SARS CORONAVIRUS 2 (TAT 6-24 HRS) Nasopharyngeal Nasopharyngeal Swab     Status: None   Collection Time: 04/04/19  8:27 AM   Specimen: Nasopharyngeal Swab  Result Value Ref Range Status   SARS Coronavirus 2 NEGATIVE NEGATIVE Final    Comment: (NOTE) SARS-CoV-2 target nucleic acids are NOT DETECTED. The SARS-CoV-2 RNA is generally detectable in upper and lower respiratory specimens during the acute phase of infection. Negative results do not preclude SARS-CoV-2 infection, do not rule out co-infections with other pathogens, and  should not be used as the sole basis for treatment or other patient management decisions. Negative results must be combined with clinical observations, patient history, and epidemiological information. The expected result is Negative. Fact Sheet for Patients: SugarRoll.be Fact Sheet for Healthcare Providers: https://www.woods-mathews.com/ This test is not yet approved or cleared by the [place] FDA and  has been authorized for detection and/or diagnosis of SARS-CoV-2 by FDA under an Emergency Use Authorization (EUA). This EUA will remain  in effect (meaning this test can be used) for the duration of the COVID-19 declaration under Section 56 4(b)(1) of the Act, 21 U.S.C. section 360bbb-3(b)(1), unless the authorization is terminated or revoked sooner. Performed at [facility], [place] [address], [place], [place] [address]       Radiology Studies: No results found.  Scheduled Meds: . diltiazem  180 mg Oral Daily  . ezetimibe  10 mg Oral  Daily  . lisinopril  40 mg Oral Daily  . metoprolol tartrate  12.5 mg Oral BID  . pantoprazole  40 mg Oral Daily  . rosuvastatin  20 mg Oral Daily  . spironolactone  12.5 mg Oral Daily   Continuous Infusions: . sodium chloride 100 mL/hr at 04/05/19 1349     LOS: 0 days   Time spent: 30 minutes   Hughie Clossavi Jadon Harbaugh, MD [facility]  04/05/2019, 2:39 PM   To contact the attending provider between 7A-7P or the covering provider during after hours 7P-7A, please log into the web site [email or web address] and use password [email or web address].

## 2019-04-05 NOTE — Plan of Care (Signed)
  Problem: Education: Goal: Knowledge of General Education information will improve Description Including pain rating scale, medication(s)/side effects and non-pharmacologic comfort measures Outcome: Progressing   

## 2019-04-06 ENCOUNTER — Inpatient Hospital Stay (HOSPITAL_COMMUNITY): Payer: Medicare PPO | Admitting: Certified Registered"

## 2019-04-06 ENCOUNTER — Encounter (HOSPITAL_COMMUNITY)
Admission: EM | Disposition: A | Discharge status: Home / Self Care | Payer: Self-pay | Source: Home / Self Care | Attending: Family Medicine

## 2019-04-06 ENCOUNTER — Encounter (HOSPITAL_COMMUNITY): Payer: Self-pay | Admitting: *Deleted

## 2019-04-06 DIAGNOSIS — K5790 Diverticulosis of intestine, part unspecified, without perforation or abscess without bleeding: Secondary | ICD-10-CM | POA: Diagnosis present

## 2019-04-06 DIAGNOSIS — K644 Residual hemorrhoidal skin tags: Secondary | ICD-10-CM | POA: Diagnosis present

## 2019-04-06 DIAGNOSIS — K648 Other hemorrhoids: Secondary | ICD-10-CM | POA: Diagnosis present

## 2019-04-06 DIAGNOSIS — S3669XA Other injury of rectum, initial encounter: Secondary | ICD-10-CM

## 2019-04-06 HISTORY — PX: HEMOSTASIS CLIP PLACEMENT: SHX6857

## 2019-04-06 HISTORY — PX: COLONOSCOPY WITH PROPOFOL: SHX5780

## 2019-04-06 LAB — BASIC METABOLIC PANEL
Anion gap: 10 (ref 5–15)
BUN: 5 mg/dL — ABNORMAL LOW (ref 8–23)
CO2: 21 mmol/L — ABNORMAL LOW (ref 22–32)
Calcium: 9.4 mg/dL (ref 8.9–10.3)
Chloride: 111 mmol/L (ref 98–111)
Creatinine, Ser: 0.87 mg/dL (ref 0.61–1.24)
GFR calc Af Amer: >60 mL/min (ref >60)
GFR calc non Af Amer: >60 mL/min (ref >60)
Glucose, Bld: 113 mg/dL — ABNORMAL HIGH (ref 70–99)
Potassium: 3.7 mmol/L (ref 3.5–5.1)
Sodium: 142 mmol/L (ref 135–145)

## 2019-04-06 LAB — CBC WITH DIFFERENTIAL/PLATELET
Abs Immature Granulocytes: 0.02 10*3/uL (ref 0.00–0.07)
Basophils Absolute: 0.1 10*3/uL (ref 0.0–0.1)
Basophils Relative: 1 %
Eosinophils Absolute: 0.2 10*3/uL (ref 0.0–0.5)
Eosinophils Relative: 4 %
HCT: 32.8 % — ABNORMAL LOW (ref 39.0–52.0)
Hemoglobin: 10.6 g/dL — ABNORMAL LOW (ref 13.0–17.0)
Immature Granulocytes: 0 %
Lymphocytes Relative: 32 %
Lymphs Abs: 1.9 10*3/uL (ref 0.7–4.0)
MCH: 31.3 pg (ref 26.0–34.0)
MCHC: 32.3 g/dL (ref 30.0–36.0)
MCV: 96.8 fL (ref 80.0–100.0)
Monocytes Absolute: 0.7 10*3/uL (ref 0.1–1.0)
Monocytes Relative: 11 %
Neutro Abs: 3.2 10*3/uL (ref 1.7–7.7)
Neutrophils Relative %: 52 %
Platelets: 185 10*3/uL (ref 150–400)
RBC: 3.39 MIL/uL — ABNORMAL LOW (ref 4.22–5.81)
RDW: 12.6 % (ref 11.5–15.5)
WBC: 6 10*3/uL (ref 4.0–10.5)
nRBC: 0 % (ref 0.0–0.2)

## 2019-04-06 SURGERY — COLONOSCOPY WITH PROPOFOL
Anesthesia: Monitor Anesthesia Care

## 2019-04-06 MED ORDER — POLYETHYLENE GLYCOL 3350 17 G PO PACK: 17.0000 g | PACK | Freq: Every day | ORAL | 0 refills | Status: AC

## 2019-04-06 MED ORDER — PROPOFOL 500 MG/50ML IV EMUL
INTRAVENOUS | Status: DC | PRN
  Administered 2019-04-06: 100 ug/kg/min via INTRAVENOUS

## 2019-04-06 MED ORDER — DOCUSATE SODIUM 100 MG PO CAPS: 100.0000 mg | ORAL_CAPSULE | Freq: Two times a day (BID) | ORAL | 0 refills | Status: AC

## 2019-04-06 MED ORDER — PROPOFOL 10 MG/ML IV BOLUS
INTRAVENOUS | Status: DC | PRN
  Administered 2019-04-06: 20 mg via INTRAVENOUS

## 2019-04-06 MED ORDER — SENNA 8.6 MG PO TABS: 1.0000 | ORAL_TABLET | Freq: Every day | ORAL | 0 refills | Status: AC

## 2019-04-06 MED ORDER — LACTATED RINGERS IV SOLN
INTRAVENOUS | Status: DC | PRN
  Administered 2019-04-06: 12:00:00 via INTRAVENOUS

## 2019-04-06 MED ORDER — LIDOCAINE HCL URETHRAL/MUCOSAL 2 % EX GEL
CUTANEOUS | Status: AC
  Filled 2019-04-06: qty 20

## 2019-04-06 MED ORDER — LIDOCAINE HCL URETHRAL/MUCOSAL 2 % EX GEL
CUTANEOUS | Status: DC | PRN
  Administered 2019-04-06: 1 via TOPICAL

## 2019-04-06 MED ORDER — SODIUM CHLORIDE 0.9 % IV SOLN: INTRAVENOUS | Status: DC

## 2019-04-06 SURGICAL SUPPLY — 22 items

## 2019-04-06 NOTE — Anesthesia Postprocedure Evaluation (Signed)
Anesthesia Post Note  Patient: James Jacobs  Procedure(s) Performed: COLONOSCOPY WITH PROPOFOL (N/A )     Patient location during evaluation: PACU Anesthesia Type: MAC Level of consciousness: awake and alert Pain management: pain level controlled Vital Signs Assessment: post-procedure vital signs reviewed and stable Respiratory status: spontaneous breathing, nonlabored ventilation, respiratory function stable and patient connected to nasal cannula oxygen Cardiovascular status: stable and blood pressure returned to baseline Postop Assessment: no apparent nausea or vomiting Anesthetic complications: no    Last Vitals:  Vitals:   04/06/19 1300 04/06/19 1320  BP: (!) 153/71 (!) 176/105  Pulse: 88 87  Resp: (!) 21 16  Temp: 36.7 C   SpO2: 94% 98%    Last Pain:  Vitals:   04/06/19 1320  TempSrc:   PainSc: 0-No pain                 [name] DAVID

## 2019-04-06 NOTE — Progress Notes (Signed)
James Jacobs [name] to be discharged Home per MD order. Discussed prescriptions and follow up appointments with the patient. Medication list explained in detail. Patient verbalized understanding.  Skin clean, dry and intact without evidence of skin break down, no evidence of skin tears noted. IV catheter discontinued intact. Site without signs and symptoms of complications. Dressing and pressure applied. Pt denies pain at the site currently. No complaints noted.  Patient free of lines, drains, and wounds.   An After Visit Summary (AVS) was printed and given to the patient. Patient escorted via wheelchair, and discharged home via private auto.  Amaryllis Dyke, RN

## 2019-04-06 NOTE — Transfer of Care (Signed)
Immediate Anesthesia Transfer of Care Note  Patient: James Jacobs  Procedure(s) Performed: COLONOSCOPY WITH PROPOFOL (N/A )  Patient Location: PACU and Endoscopy Unit  Anesthesia Type:MAC  Level of Consciousness: awake and alert   Airway & Oxygen Therapy: Patient Spontanous Breathing  Post-op Assessment: Report given to RN and Post -op Vital signs reviewed and stable  Post vital signs: Reviewed and stable  Last Vitals:  Vitals Value Taken Time  BP 153/71 04/06/19 1300  Temp 36.7 C 04/06/19 1300  Pulse 89 04/06/19 1302  Resp 18 04/06/19 1302  SpO2 95 % 04/06/19 1302  Vitals shown include unvalidated device data.  Last Pain:  Vitals:   04/06/19 1300  TempSrc: Temporal  PainSc: 0-No pain         Complications: No apparent anesthesia complications

## 2019-04-06 NOTE — Anesthesia Procedure Notes (Signed)
Procedure Name: MAC Date/Time: 04/06/2019 12:26 PM Performed by: Inda Coke, CRNA Pre-anesthesia Checklist: Patient identified, Emergency Drugs available, Suction available, Timeout performed and Patient being monitored Patient Re-evaluated:Patient Re-evaluated prior to induction Oxygen Delivery Method: Nasal cannula Induction Type: IV induction Dental Injury: Teeth and Oropharynx as per pre-operative assessment

## 2019-04-06 NOTE — Discharge Summary (Signed)
Physician Discharge Summary  James Jacobs HFW:[id number] DOB: 11-19-1940 DOA: 04/04/2019  PCP: Crist Infante, MD  Admit date: 04/04/2019 Discharge date: 04/06/2019  Admitted From: Home Disposition: Home  Recommendations for Outpatient Follow-up:  1. Follow up with PCP in 1-2 weeks 2. Please obtain BMP/CBC in one week 3. Please follow up on the following pending results:  Home Health: None Equipment/Devices: None  Discharge Condition:Stable CODE STATUS: Full code Diet recommendation: High-fiber diet  Subjective: Seen and examined prior to colonoscopy.  He has not had any further rectal bleeding.  No abdominal pain.  Eager to go home after colonoscopy.  Brief/Interim Summary: James Jacobs Jris a 78 y.o.malewith a past medical history significant for type 2 diabetes without complication (currently diet-controlled), hypertension, hypercholesterolemia, history of supraventricular tachycardia and diverticulosis; who presented to the [facility] emergency department with complaints of multiple episodes of bright red blood per rectum at home. Patient expressed no recent changes on his diet; no abdominal pain, no nausea, no vomiting, no chest pain, no shortness of breath, no palpitations, no dizziness, no lightheadedness, no use of NSAIDs or any other acute complaints. While waiting in the ED patient had 4 episodes of bloody stools and he was found with a hemoglobin of 14.6, stable vital signs, and normal BUN and creatinine. CBGs in the 120s range.  Patient was admitted by [facility] in order to obtain GI services.  Hemoglobin was monitored which dropped a little bit but not significant enough to require transfusion.  GI performed colonoscopy on 04/06/2019.  He was found to have internal and external nonbleeding and nonthrombosed hemorrhoids and diverticulosis as well as rectal tear.  GI recommended being aggressive with stool softeners, MiraLAX and Colace.  If these do not help then he  may use Preparation H and may also need nifedipine ointment for symptom relief. Dr. Rush Landmark is also going to send a message to Dr. Arelia Longest so he can be considered for possible hemorrhoidectomy if those measures do not work for him.  Patient is a stable post colonoscopy and wants to go home.  GI has cleared him as well so he is going to be discharged in stable condition.  Discharge Diagnoses:  Principal Problem:   Acute lower GI bleeding Active Problems:   SVT (supraventricular tachycardia) (HCC)   Essential hypertension   Diverticulosis   Internal hemorrhoids   External hemorrhoids   Rectal tear    Discharge Instructions  Discharge Instructions    Discharge patient   Complete by: As directed    Discharge disposition: 01-Home or Self Care   Discharge patient date: 04/06/2019     Allergies as of 04/06/2019   No Known Allergies     Medication List    TAKE these medications   acetaminophen 500 MG tablet Commonly known as: TYLENOL Take 1,000 mg by mouth every 6 (six) hours as needed for moderate pain or headache.   ALPRAZolam 0.25 MG tablet Commonly known as: XANAX Take 0.25 mg by mouth daily as needed for anxiety or sleep.   ascorbic acid 1000 MG tablet Commonly known as: VITAMIN C Take 1,000 mg by mouth daily.   aspirin 81 MG tablet Take 81 mg by mouth daily.   cholecalciferol 1000 units tablet Commonly known as: VITAMIN D Take 1,000 Units by mouth daily.   diltiazem 180 MG 24 hr capsule Commonly known as: CARDIZEM CD Take 1 capsule (180 mg total) by mouth daily.   docusate sodium 100 MG capsule Commonly known  as: Colace Take 1 capsule (100 mg total) by mouth 2 (two) times daily.   ezetimibe 10 MG tablet Commonly known as: ZETIA Take 10 mg by mouth daily.   hydrochlorothiazide 25 MG tablet Commonly known as: HYDRODIURIL one tablet by mouth daily   HYDROcodone-acetaminophen 5-325 MG tablet Commonly known as: NORCO/VICODIN Take 1-2 tablets by mouth  every 4 (four) hours as needed (pain).   ibandronate 150 MG tablet Commonly known as: BONIVA Take 150 mg by mouth every 30 (thirty) days. Take in the morning with a full glass of water, on an empty stomach, and do not take anything else by mouth or lie down for the next 30 min.   metoprolol tartrate 25 MG tablet Commonly known as: LOPRESSOR Take 12.5 mg by mouth 2 (two) times daily.   multivitamin with minerals Tabs tablet Take 1 tablet by mouth daily.   polyethylene glycol 17 g packet Commonly known as: MIRALAX / GLYCOLAX Take 17 g by mouth daily.   quinapril 40 MG tablet Commonly known as: ACCUPRIL Take 40 mg by mouth daily.   rosuvastatin 20 MG tablet Commonly known as: CRESTOR Take 5 mg by mouth daily.   senna 8.6 MG Tabs tablet Commonly known as: SENOKOT Take 1 tablet (8.6 mg total) by mouth at bedtime.   spironolactone 25 MG tablet Commonly known as: ALDACTONE Take 1 tablet (25 mg total) by mouth daily.   tadalafil 20 MG tablet Commonly known as: CIALIS Take 5 mg by mouth daily.      Follow-up Information    Rodrigo Ran, MD Follow up in 1 week(s).   Specialty: Internal Medicine Contact information: [address] [place] [address] [phone]          No Known Allergies  Consultations: GI   Procedures/Studies: No results found.   Discharge Exam: Vitals:   04/06/19 1300 04/06/19 1320  BP: (!) 153/71 (!) 176/105  Pulse: 88 87  Resp: (!) 21 16  Temp: 98.1 F (36.7 C)   SpO2: 94% 98%   Vitals:   04/06/19 0824 04/06/19 1137 04/06/19 1300 04/06/19 1320  BP: (!) 173/83 (!) 196/91 (!) 153/71 (!) 176/105  Pulse: 81  88 87  Resp: 18 20 (!) 21 16  Temp: 97.7 F (36.5 C) 98.5 F (36.9 C) 98.1 F (36.7 C)   TempSrc: Oral Oral Temporal   SpO2: 97% 99% 94% 98%  Weight:  85.3 kg    Height:   (1.727 m)      General: Pt is alert, awake, not in acute distress Cardiovascular: RRR, S1/S2 +, no rubs, no gallops Respiratory: CTA  bilaterally, no wheezing, no rhonchi Abdominal: Soft, NT, ND, bowel sounds + Extremities: no edema, no cyanosis    The results of significant diagnostics from this hospitalization (including imaging, microbiology, ancillary and laboratory) are listed below for reference.     Microbiology: Recent Results (from the past 240 hour(s))  SARS CORONAVIRUS 2 (TAT 6-24 HRS) Nasopharyngeal Nasopharyngeal Swab     Status: None   Collection Time: 04/04/19  8:27 AM   Specimen: Nasopharyngeal Swab  Result Value Ref Range Status   SARS Coronavirus 2 NEGATIVE NEGATIVE Final    Comment: (NOTE) SARS-CoV-2 target nucleic acids are NOT DETECTED. The SARS-CoV-2 RNA is generally detectable in upper and lower respiratory specimens during the acute phase of infection. Negative results do not preclude SARS-CoV-2 infection, do not rule out co-infections with other pathogens, and should not be used as the sole basis for treatment or  other patient management decisions. Negative results must be combined with clinical observations, patient history, and epidemiological information. The expected result is Negative. Fact Sheet for Patients: HairSlick.nohttps://www.fda.gov/media/138098/download Fact Sheet for Healthcare Providers: quierodirigir.comhttps://www.fda.gov/media/138095/download This test is not yet approved or cleared by the [place] FDA and  has been authorized for detection and/or diagnosis of SARS-CoV-2 by FDA under an Emergency Use Authorization (EUA). This EUA will remain  in effect (meaning this test can be used) for the duration of the COVID-19 declaration under Section 56 4(b)(1) of the Act, 21 U.S.C. section 360bbb-3(b)(1), unless the authorization is terminated or revoked sooner. Performed at [facility], [address] [place], [place] [address]      Labs: BNP (last 3 results) Recent Labs    04/27/18 1243  BNP 144.0*   Basic Metabolic Panel: Recent Labs  Lab 04/04/19 0803 04/06/19 0949  NA  141 142  K 3.5 3.7  CL 109 111  CO2 24 21*  GLUCOSE 127* 113*  BUN 17 5*  CREATININE 0.83 0.87  CALCIUM 9.5 9.4   Liver Function Tests: Recent Labs  Lab 04/04/19 0803  AST 18  ALT 22  ALKPHOS 49  BILITOT 0.7  PROT 6.7  ALBUMIN 3.9   No results for input(s): LIPASE, AMYLASE in the last 168 hours. No results for input(s): AMMONIA in the last 168 hours. CBC: Recent Labs  Lab 04/04/19 0803 04/04/19 1349 04/05/19 1218 04/06/19 0949  WBC 7.4 7.7 7.4 6.0  NEUTROABS 4.6  --   --  3.2  HGB 13.6 12.3* 11.6* 10.6*  HCT 42.3 38.5* 35.9* 32.8*  MCV 97.0 98.5 97.8 96.8  PLT 209 202 218 185   Cardiac Enzymes: No results for input(s): CKTOTAL, CKMB, CKMBINDEX, TROPONINI in the last 168 hours. BNP: Invalid input(s): POCBNP CBG: No results for input(s): GLUCAP in the last 168 hours. D-Dimer No results for input(s): DDIMER in the last 72 hours. Hgb A1c Recent Labs    04/04/19 0803  HGBA1C 5.5   Lipid Profile No results for input(s): CHOL, HDL, LDLCALC, TRIG, CHOLHDL, LDLDIRECT in the last 72 hours. Thyroid function studies No results for input(s): TSH, T4TOTAL, T3FREE, THYROIDAB in the last 72 hours.  Invalid input(s): FREET3 Anemia work up No results for input(s): VITAMINB12, FOLATE, FERRITIN, TIBC, IRON, RETICCTPCT in the last 72 hours. Urinalysis    Component Value Date/Time   COLORURINE YELLOW 04/27/2018 1342   APPEARANCEUR CLEAR 04/27/2018 1342   LABSPEC 1.009 04/27/2018 1342   PHURINE 6.0 04/27/2018 1342   GLUCOSEU NEGATIVE 04/27/2018 1342   HGBUR NEGATIVE 04/27/2018 1342   BILIRUBINUR NEGATIVE 04/27/2018 1342   KETONESUR 5 (A) 04/27/2018 1342   PROTEINUR NEGATIVE 04/27/2018 1342   NITRITE NEGATIVE 04/27/2018 1342   LEUKOCYTESUR NEGATIVE 04/27/2018 1342   Sepsis Labs Invalid input(s): PROCALCITONIN,  WBC,  LACTICIDVEN Microbiology Recent Results (from the past 240 hour(s))  SARS CORONAVIRUS 2 (TAT 6-24 HRS) Nasopharyngeal Nasopharyngeal Swab     Status:  None   Collection Time: 04/04/19  8:27 AM   Specimen: Nasopharyngeal Swab  Result Value Ref Range Status   SARS Coronavirus 2 NEGATIVE NEGATIVE Final    Comment: (NOTE) SARS-CoV-2 target nucleic acids are NOT DETECTED. The SARS-CoV-2 RNA is generally detectable in upper and lower respiratory specimens during the acute phase of infection. Negative results do not preclude SARS-CoV-2 infection, do not rule out co-infections with other pathogens, and should not be used as the sole basis for treatment or other patient management decisions. Negative results  must be combined with clinical observations, patient history, and epidemiological information. The expected result is Negative. Fact Sheet for Patients: HairSlick.no Fact Sheet for Healthcare Providers: quierodirigir.com This test is not yet approved or cleared by the [place] FDA and  has been authorized for detection and/or diagnosis of SARS-CoV-2 by FDA under an Emergency Use Authorization (EUA). This EUA will remain  in effect (meaning this test can be used) for the duration of the COVID-19 declaration under Section 56 4(b)(1) of the Act, 21 U.S.C. section 360bbb-3(b)(1), unless the authorization is terminated or revoked sooner. Performed at [facility], [address] [place], [place] [address]      Time coordinating discharge: Over 30 minutes  SIGNED:   Hughie Closs, MD  [facility] 04/06/2019, 1:39 PM  If 7PM-7AM, please contact night-coverage [email or web address] Password [email or web address]

## 2019-04-06 NOTE — Op Note (Signed)
[facility] Patient Name: James Jacobs Procedure Date : 04/06/2019 MRN: [id number] Attending MD: Justice Britain , MD Date of Birth: 11-17-1940 CSN: [id number] Age: 78 Admit Type: Inpatient Procedure:                Colonoscopy Indications:              Hematochezia, Diverticula, Exclusion of colon                            diverticulosis with hemorrhage Providers:                Justice Britain, MD, Elmer Ramp. Tilden Dome, RN, Lazaro Arms, Technician Referring MD:             Gatha Mayer, MD, [facility] Medicines:                Monitored Anesthesia Care Complications:            No immediate complications. Estimated Blood Loss:     Estimated blood loss was minimal. Procedure:                Pre-Anesthesia Assessment:                           - Prior to the procedure, a History and Physical                            was performed, and patient medications and                            allergies were reviewed. The patient's tolerance of                            previous anesthesia was also reviewed. The risks                            and benefits of the procedure and the sedation                            options and risks were discussed with the patient.                            All questions were answered, and informed consent                            was obtained. Prior Anticoagulants: The patient has                            taken no previous anticoagulant or antiplatelet                            agents except for aspirin. ASA Grade Assessment:  III - A patient with severe systemic disease. After                            reviewing the risks and benefits, the patient was                            deemed in satisfactory condition to undergo the                            procedure.                           After obtaining informed consent, the colonoscope                            was  passed under direct vision. Throughout the                            procedure, the patient's blood pressure, pulse, and                            oxygen saturations were monitored continuously. The                            CF-HQ190L ([id number] Olympus colonoscope was                            introduced through the anus and advanced to the 4                            cm into the ileum. The colonoscopy was performed                            without difficulty. The patient tolerated the                            procedure. The quality of the bowel preparation was                            good. The terminal ileum, ileocecal valve,                            appendiceal orifice, and rectum were photographed. Scope In: 12:35:36 PM Scope Out: 12:53:05 PM Scope Withdrawal Time: 0 hours 11 minutes 15 seconds  Total Procedure Duration: 0 hours 17 minutes 29 seconds  Findings:      The digital rectal exam findings include prolapsed internal & external       hemorrhoids. Pertinent negatives include no palpable rectal lesions.      The terminal ileum and ileocecal valve appeared normal.      Many small and large-mouthed diverticula were found in the recto-sigmoid       colon, sigmoid colon and descending colon.      A non-bleeding mucosal tear of uncertain etiology was found in the       distal rectum. This  measured 10 mm in length. To repair the defect, the       tissue edges were approximated and one hemostatic clip was successfully       placed (MR conditional). Closure of the defect was successful. There was       no bleeding during, or at the end, of the procedure.      Normal mucosa was found in the entire colon.      Non-bleeding non-thrombosed external and internal hemorrhoids were found       during retroflexion, during perianal exam and during digital exam. The       hemorrhoids were Grade II (internal hemorrhoids that prolapse but reduce       spontaneously). Impression:                - Hemorrhoids found on digital rectal exam.                           - The examined portion of the ileum was normal.                           - Diverticulosis in the recto-sigmoid colon, in the                            sigmoid colon and in the descending colon.                           - Mucosal tear in the distal rectum. Clip (MR                            conditional) was placed.                           - Normal mucosa in the entire examined colon.                           - Non-bleeding non-thrombosed external and internal                            hemorrhoids. Recommendation:           - The patient will be observed post-procedure,                            until all discharge criteria are met.                           - Return patient to hospital ward for ongoing care.                           - High fiber diet.                           - Take Sitz baths QHS.                           - For healing of the rectal ulceration near the  internal hemorrhoid I recommend: Use FiberCon 1                            tablet PO daily & Colace capsule(s) orally 200 mg                            daily & Preparation H cream: Apply externally BID.                           - If he has hard stools/constipation he needs to                            add Miralax 1-2 times daily.                           - Although this is not a typical anal fissure, it                            does go near the and into the dentate line. He has                            no pain, but may benefit from Nifedipine/NTG                            ointment if he begins to have discomfort.                           - Will send to Dr. Carlean Purl for review, however,                            large component of external hemorrhoids are such                            that banding probably not enough to aid in                            treatment and may require Colorectal surgery  for                            discussion of true hemorrhoidectomy, but if                            optimization of stool occurs may not need anything                            further.                           - The findings and recommendations were discussed                            with the patient.                           -  The findings and recommendations were discussed                            with the referring physician. Procedure Code(s):        --- Professional ---                           [phone], Colonoscopy, flexible; diagnostic, including                            collection of specimen(s) by brushing or washing,                            when performed (separate procedure)                           [phone], Unlisted procedure, rectum Diagnosis Code(s):        --- Professional ---                           K64.1, Second degree hemorrhoids                           K57.30, Diverticulosis of large intestine without                            perforation or abscess without bleeding                           S36.69XA, Other injury of rectum, initial encounter                           K92.1, Melena (includes Hematochezia) CPT copyright 2019 American [employer] Association. All rights reserved. The codes documented in this report are preliminary and upon coder review may  be revised to meet current compliance requirements. Justice Britain, MD 04/06/2019 1:14:10 PM Number of Addenda: 0

## 2019-04-06 NOTE — Discharge Instructions (Signed)

## 2019-04-06 NOTE — Anesthesia Preprocedure Evaluation (Signed)
Anesthesia Evaluation  Patient identified by MRN, date of birth, ID band Patient awake    Reviewed: Allergy & Precautions, NPO status , Patient's Chart, lab work & pertinent test results  Airway Mallampati: I  TM Distance: >3 FB Neck ROM: Full    Dental   Pulmonary    Pulmonary exam normal        Cardiovascular hypertension, Pt. on medications Normal cardiovascular exam     Neuro/Psych    GI/Hepatic   Endo/Other  diabetes  Renal/GU      Musculoskeletal   Abdominal   Peds  Hematology   Anesthesia Other Findings   Reproductive/Obstetrics                             Anesthesia Physical Anesthesia Plan  ASA: II  Anesthesia Plan: MAC   Post-op Pain Management:    Induction: Intravenous  PONV Risk Score and Plan: 1 and Ondansetron  Airway Management Planned: Nasal Cannula  Additional Equipment:   Intra-op Plan:   Post-operative Plan:   Informed Consent: I have reviewed the patients History and Physical, chart, labs and discussed the procedure including the risks, benefits and alternatives for the proposed anesthesia with the patient or authorized representative who has indicated his/her understanding and acceptance.       Plan Discussed with: CRNA and Surgeon  Anesthesia Plan Comments:         Anesthesia Quick Evaluation

## 2019-04-06 NOTE — Interval H&P Note (Signed)
History and Physical Interval Note:  04/06/2019 12:15 PM  James Jacobs  has presented today for surgery, with the diagnosis of hemataochezia.  The various methods of treatment have been discussed with the patient and family. After consideration of risks, benefits and other options for treatment, the patient has consented to  Procedure(s): COLONOSCOPY WITH PROPOFOL (N/A) as a surgical intervention.  The patient's history has been reviewed, patient examined, no change in status, stable for surgery.  I have reviewed the patient's chart and labs.  Questions were answered to the patient's satisfaction.     [employer]

## 2019-04-09 ENCOUNTER — Encounter (HOSPITAL_COMMUNITY): Payer: Self-pay | Admitting: Gastroenterology

## 2019-06-03 ENCOUNTER — Telehealth: Payer: Self-pay | Admitting: Cardiovascular Disease

## 2019-06-03 NOTE — Telephone Encounter (Signed)
New message:     Patient calling concering a medication change. Please call patient.

## 2019-06-03 NOTE — Telephone Encounter (Signed)
Returned call to pt he states that he received a new bottle of cartia XL and states that his BP has been high since (x5days) BP running 167/95 168/92 158/76 133/67 128/60 139/63 193/63. He will continue to take/log his BP and keep an "eye on this" he will CB if BP goes any higher and will go to ER if he feels that this is needed. He will CB if Bp get any higher

## 2020-08-17 IMAGING — CR DG LUMBAR SPINE 2-3V
2 series · 2 of 2 positions shown · non-contrast
Comparison: None.

CLINICAL DATA: Right L4 hemilaminotomy and microdiskectomy.

EXAM:
LUMBAR SPINE - 2-3 VIEW

[lateral (1 of 2)]
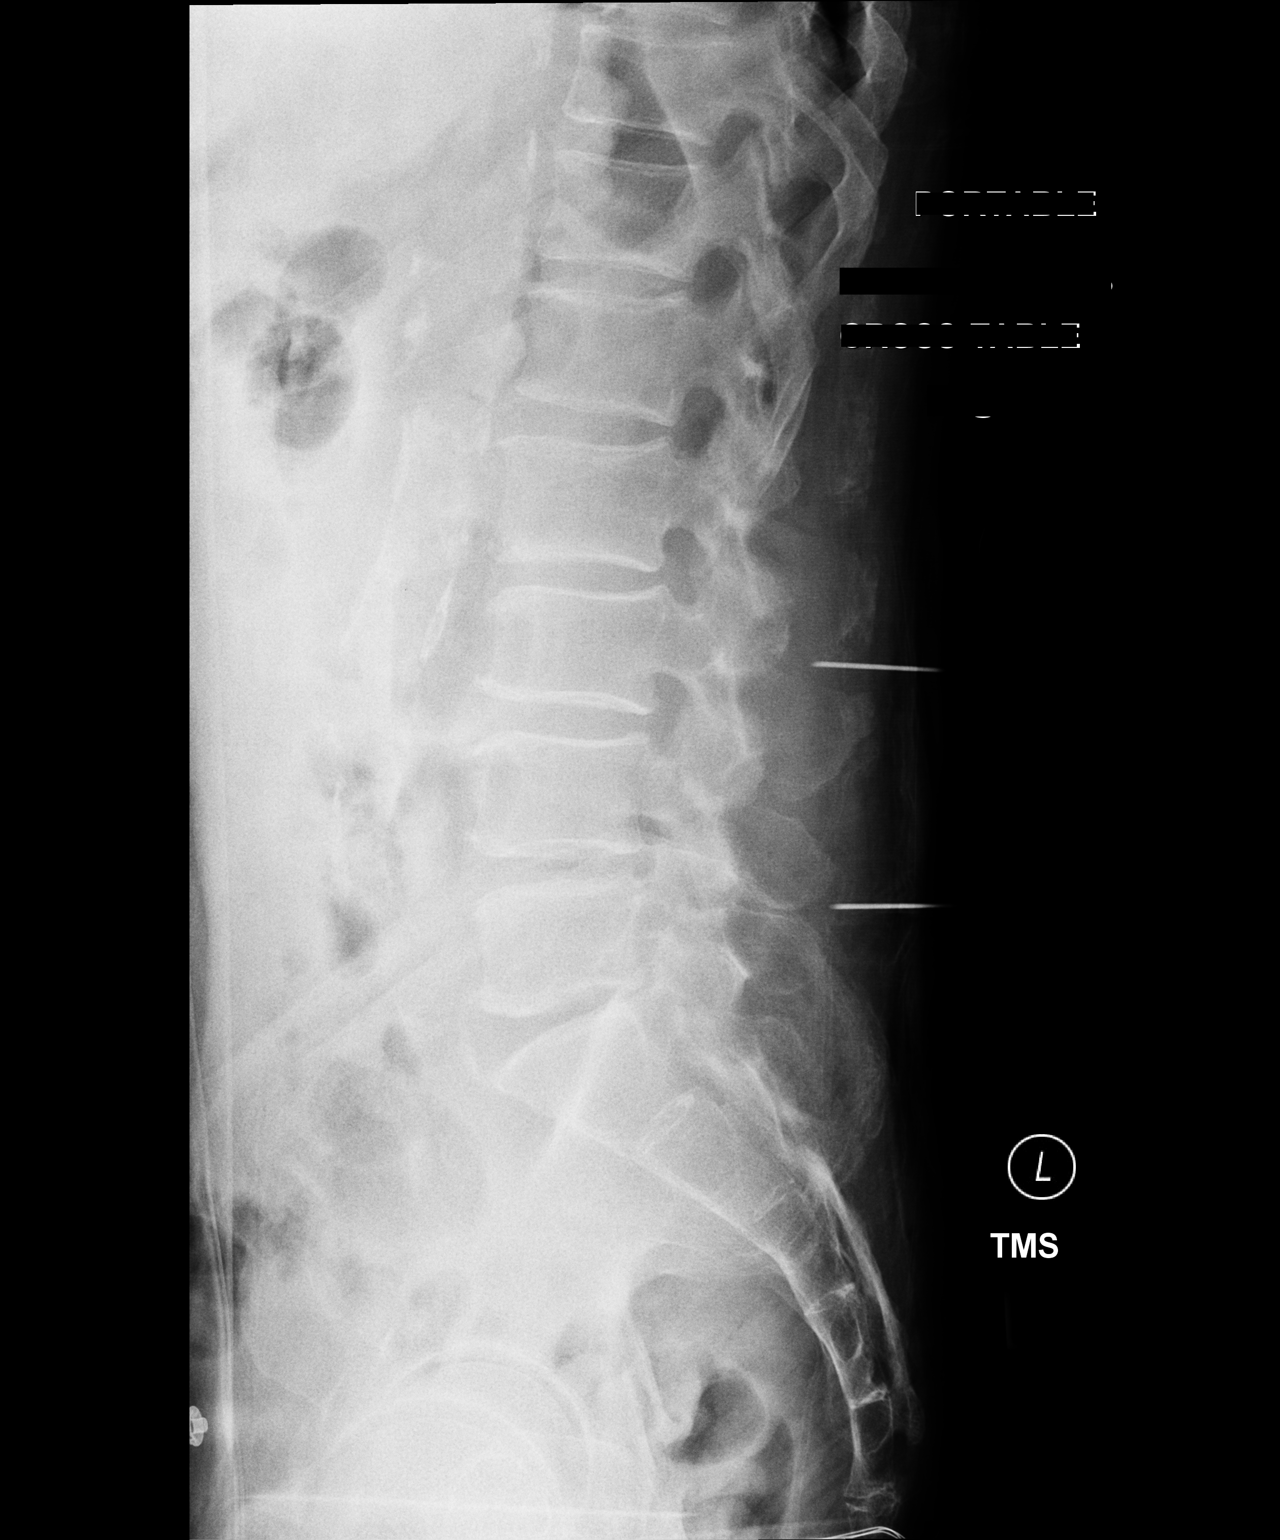

[lateral (2 of 2)]
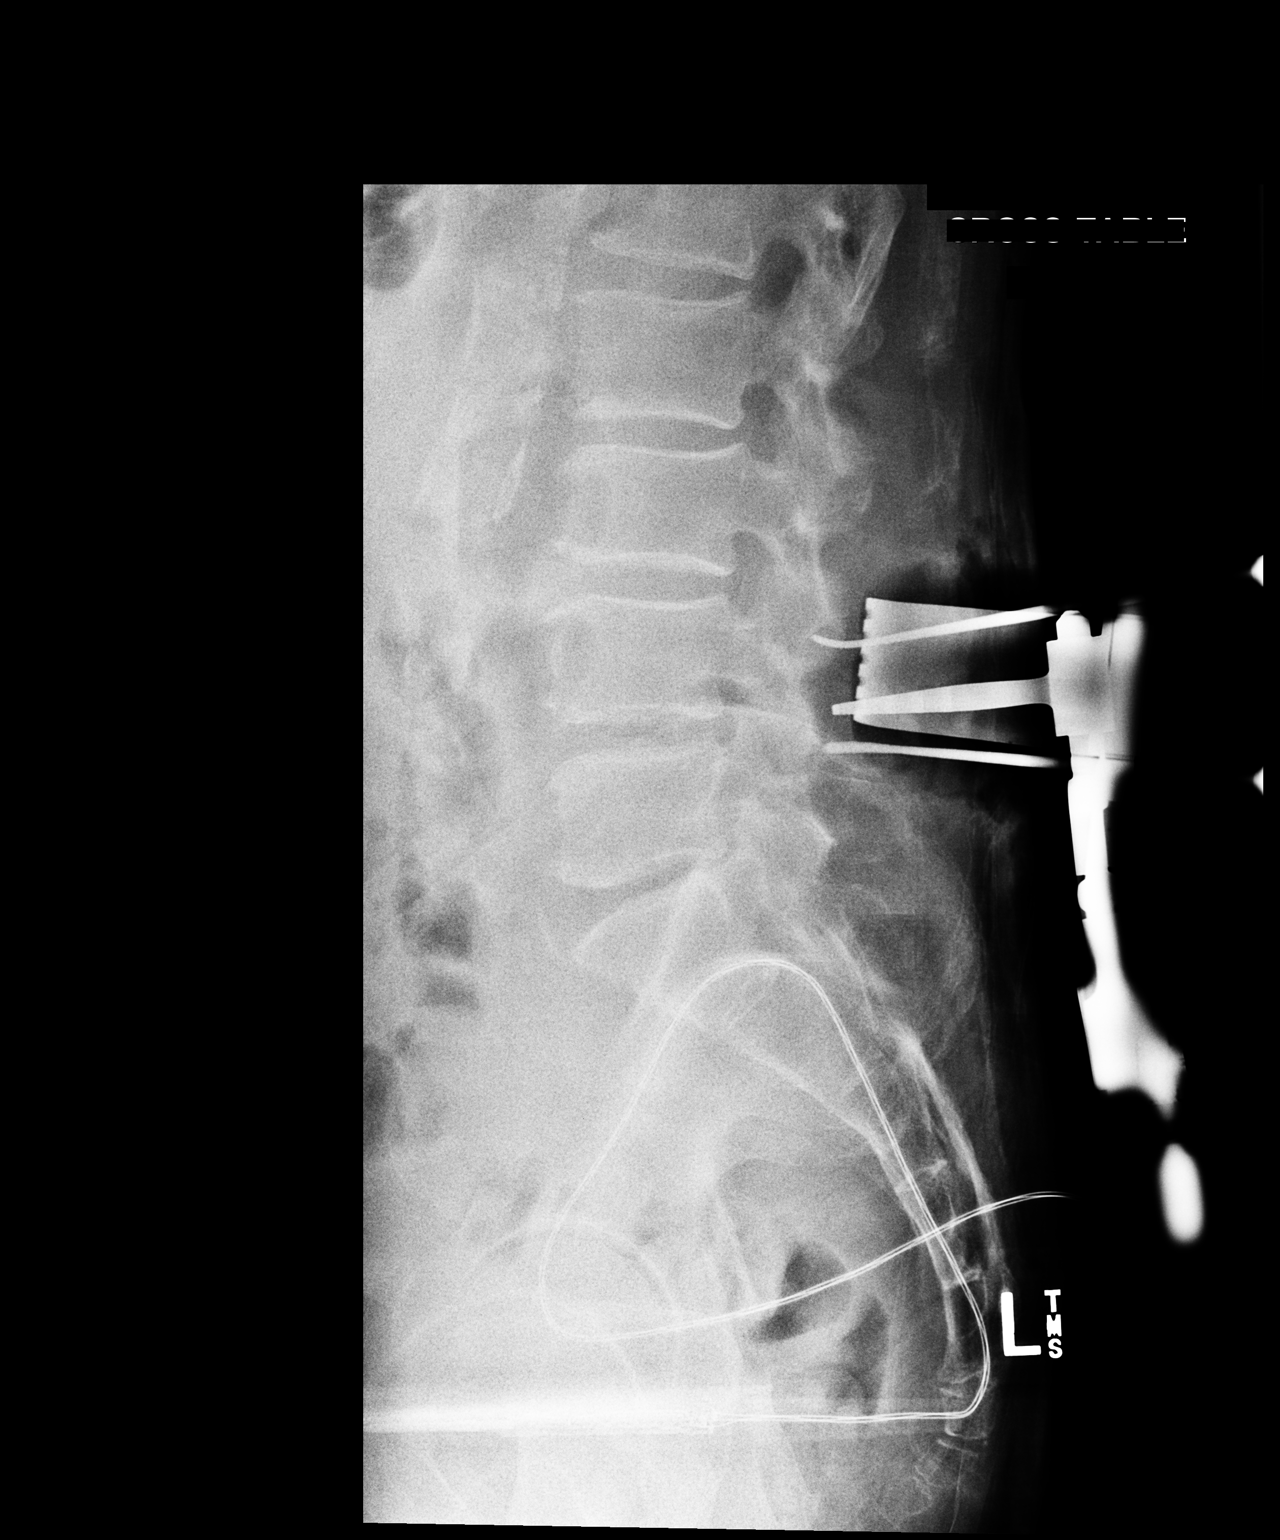

[2 of 2 positions shown; findings below may reference images not displayed]

FINDINGS: Vertebral body alignment and heights are normal. There is mild
spondylosis of the lumbar spine to include facet arthropathy.
Minimal disc space narrowing at the L2-3, L4-5 and L5-S1 levels.
Initial image demonstrates surgical instrument over the posterior
soft tissues with tip between the L2 and 3 spinous processes and
more inferior surgical instrument with tip over the posterior soft
tissues between the L4 and L5 spinous processes. Subsequent image
demonstrates surgical instrument with tip over the posterior
elements at the L4 level.
IMPRESSION: Intraop localization as described above.

Mild spondylosis of the lumbar spine with mild multilevel disc
disease.

## 2022-02-22 ENCOUNTER — Ambulatory Visit: Payer: Medicare PPO | Admitting: Urology

## 2022-02-22 ENCOUNTER — Encounter: Payer: Self-pay | Admitting: Urology

## 2022-02-22 VITALS — BP 156/72 | HR 56

## 2022-02-22 DIAGNOSIS — R350 Frequency of micturition: Secondary | ICD-10-CM | POA: Diagnosis not present

## 2022-02-22 DIAGNOSIS — N401 Enlarged prostate with lower urinary tract symptoms: Secondary | ICD-10-CM | POA: Diagnosis not present

## 2022-02-22 DIAGNOSIS — R3915 Urgency of urination: Secondary | ICD-10-CM

## 2022-02-22 DIAGNOSIS — N5201 Erectile dysfunction due to arterial insufficiency: Secondary | ICD-10-CM

## 2022-02-22 LAB — BLADDER SCAN AMB NON-IMAGING: Scan Result: 31

## 2022-02-22 MED ORDER — SILDENAFIL CITRATE 100 MG PO TABS: 100.0000 mg | ORAL_TABLET | Freq: Every day | ORAL | 0 refills | Status: AC | PRN

## 2022-02-22 MED ORDER — ALFUZOSIN HCL ER 10 MG PO TB24: 10.0000 mg | ORAL_TABLET | Freq: Every day | ORAL | 11 refills | Status: DC

## 2022-02-22 NOTE — Progress Notes (Unsigned)
02/22/2022 2:04 PM   [place] Viv[place] Bir 1941-04-06 [id number]  Referring provider: Crist Infante, MD [address] [place],  [place] [address]  Urinary frequency and Erectile dysfunction   HPI: Mr James Jacobs is a 81yo here for evaluation of urinary frequency, nocturia and erectile dysfunction. He has urinary urgency 1-2 times per day. Nocturia 2-3x. Urine stream fair. No straining to urinate. IPSS 17 QOL 4. He is unhappy with his urination. For the past year he has noted issues maintaining an erection. He is currently on 5mg  tadalafil daily. Good libido.    PMH: Past Medical History:  Diagnosis Date   Diabetes mellitus without complication ([place])    denies   Early cataracts, bilateral    History of nuclear stress test 2013   low risk study   HNP (herniated nucleus pulposus), lumbar    Hypercholesteremia    Hypertension    Pre-diabetes    per pt ; no medication   SVT (supraventricular tachycardia) ([place])    Wears contact lenses     Surgical History: Past Surgical History:  Procedure Laterality Date   COLONOSCOPY     COLONOSCOPY WITH PROPOFOL N/A 04/06/2019   Procedure: COLONOSCOPY WITH PROPOFOL;  Surgeon: Irving Copas., MD;  Location: [facility];  Service: Gastroenterology;  Laterality: N/A;   HEMOSTASIS CLIP PLACEMENT  04/06/2019   Procedure: HEMOSTASIS CLIP PLACEMENT;  Surgeon: Irving Copas., MD;  Location: [facility] ENDOSCOPY;  Service: Gastroenterology;;   LUMBAR LAMINECTOMY/DECOMPRESSION MICRODISCECTOMY Right 02/13/2019   Procedure: RIGHT LUMBAR FOUR  HEMILAMINOTOMY AND MICRODISCECTOMY;  Surgeon: Jovita Gamma, MD;  Location: [facility];  Service: Neurosurgery;  Laterality: Right;   TONSILLECTOMY      Home Medications:  Allergies as of 02/22/2022   No Known Allergies      Medication List        Accurate as of February 22, 2022  2:04 PM. If you have any questions, ask your nurse or doctor.          acetaminophen 500 MG tablet Commonly known as:  TYLENOL Take 1,000 mg by mouth every 6 (six) hours as needed for moderate pain or headache.   ALPRAZolam 0.25 MG tablet Commonly known as: XANAX Take 0.25 mg by mouth daily as needed for anxiety or sleep.   ascorbic acid 1000 MG tablet Commonly known as: VITAMIN C Take 1,000 mg by mouth daily.   aspirin 81 MG tablet Take 81 mg by mouth daily.   cholecalciferol 1000 units tablet Commonly known as: VITAMIN D Take 1,000 Units by mouth daily.   diltiazem 180 MG 24 hr capsule Commonly known as: CARDIZEM CD Take 1 capsule (180 mg total) by mouth daily.   ezetimibe 10 MG tablet Commonly known as: ZETIA Take 10 mg by mouth daily.   hydrochlorothiazide 25 MG tablet Commonly known as: HYDRODIURIL one tablet by mouth daily   HYDROcodone-acetaminophen 5-325 MG tablet Commonly known as: NORCO/VICODIN Take 1-2 tablets by mouth every 4 (four) hours as needed (pain).   ibandronate 150 MG tablet Commonly known as: BONIVA Take 150 mg by mouth every 30 (thirty) days. Take in the morning with a full glass of water, on an empty stomach, and do not take anything else by mouth or lie down for the next 30 min.   metoprolol tartrate 25 MG tablet Commonly known as: LOPRESSOR Take 12.5 mg by mouth 2 (two) times daily.   multivitamin with minerals Tabs tablet Take 1 tablet by mouth daily.   polyethylene glycol 17 g packet  Commonly known as: MIRALAX / GLYCOLAX Take 17 g by mouth daily.   quinapril 40 MG tablet Commonly known as: ACCUPRIL Take 40 mg by mouth daily.   rosuvastatin 20 MG tablet Commonly known as: CRESTOR Take 5 mg by mouth daily.   spironolactone 25 MG tablet Commonly known as: ALDACTONE Take 1 tablet (25 mg total) by mouth daily.   tadalafil 20 MG tablet Commonly known as: CIALIS Take 5 mg by mouth daily.        Allergies: No Known Allergies  Family History: No family history on file.  Social History:  reports that he has never smoked. He has never used  smokeless tobacco. He reports current alcohol use. He reports that he does not use drugs.  ROS: All other review of systems were reviewed and are negative except what is noted above in HPI  Physical Exam: BP (!) 156/72   Pulse (!) 56   Constitutional:  Alert and oriented, No acute distress. HEENT: [place] AT, moist mucus membranes.  Trachea midline, no masses. Cardiovascular: No clubbing, cyanosis, or edema. Respiratory: Normal respiratory effort, no increased work of breathing. GI: Abdomen is soft, nontender, nondistended, no abdominal masses GU: No CVA tenderness.  Lymph: No cervical or inguinal lymphadenopathy. Skin: No rashes, bruises or suspicious lesions. Neurologic: Grossly intact, no focal deficits, moving all 4 extremities. Psychiatric: Normal mood and affect.  Laboratory Data: Lab Results  Component Value Date   WBC 6.0 04/06/2019   HGB 10.6 (L) 04/06/2019   HCT 32.8 (L) 04/06/2019   MCV 96.8 04/06/2019   PLT 185 04/06/2019    Lab Results  Component Value Date   CREATININE 0.87 04/06/2019    No results found for: "PSA"  No results found for: "TESTOSTERONE"  Lab Results  Component Value Date   HGBA1C 5.5 04/04/2019    Urinalysis    Component Value Date/Time   COLORURINE YELLOW 04/27/2018 1342   APPEARANCEUR CLEAR 04/27/2018 1342   LABSPEC 1.009 04/27/2018 1342   PHURINE 6.0 04/27/2018 1342   GLUCOSEU NEGATIVE 04/27/2018 1342   HGBUR NEGATIVE 04/27/2018 1342   BILIRUBINUR NEGATIVE 04/27/2018 1342   KETONESUR 5 (A) 04/27/2018 1342   PROTEINUR NEGATIVE 04/27/2018 1342   NITRITE NEGATIVE 04/27/2018 1342   LEUKOCYTESUR NEGATIVE 04/27/2018 1342    No results found for: "LABMICR", "WBCUA", "RBCUA", "LABEPIT", "MUCUS", "BACTERIA"  Pertinent Imaging:  No results found for this or any previous visit.  No results found for this or any previous visit.  No results found for this or any previous visit.  No results found for this or any previous visit.  No  results found for this or any previous visit.  No results found for this or any previous visit.  No results found for this or any previous visit.  No results found for this or any previous visit.   Assessment & Plan:    1. Benign prostatic hyperplasia with urinary frequency -we will trial uroxatral 10mg  qhs - BLADDER SCAN AMB NON-IMAGING - Urinalysis, Routine w reflex microscopic  2. Erectile dysfunction -sildenafil 100mg     No follow-ups on file.  , MD  [facility] [place]

## 2022-02-22 NOTE — Progress Notes (Unsigned)
post void residual=31 

## 2022-02-22 NOTE — Patient Instructions (Signed)
Erectile Dysfunction ?Erectile dysfunction (ED) is the inability to get or keep an erection in order to have sexual intercourse. ED is considered a symptom of an underlying disorder and is not considered a disease. ED may include: ?Inability to get an erection. ?Lack of enough hardness of the erection to allow penetration. ?Loss of erection before sex is finished. ?What are the causes? ?This condition may be caused by: ?Physical causes, such as: ?Artery problems. This may include heart disease, high blood pressure, atherosclerosis, and diabetes. ?Hormonal problems, such as low testosterone. ?Obesity. ?Nerve problems. This may include back or pelvic injuries, multiple sclerosis, Parkinson's disease, spinal cord injury, and stroke. ?Certain medicines, such as: ?Pain relievers. ?Antidepressants. ?Blood pressure medicines and water pills (diuretics). ?Cancer medicines. ?Antihistamines. ?Muscle relaxants. ?Lifestyle factors, such as: ?Use of drugs such as marijuana, cocaine, or opioids. ?Excessive use of alcohol. ?Smoking. ?Lack of physical activity or exercise. ?Psychological causes, such as: ?Anxiety or stress. ?Sadness or depression. ?Exhaustion. ?Fear about sexual performance. ?Guilt. ?What are the signs or symptoms? ?Symptoms of this condition include: ?Inability to get an erection. ?Lack of enough hardness of the erection to allow penetration. ?Loss of the erection before sex is finished. ?Sometimes having normal erections, but with frequent unsatisfactory episodes. ?Low sexual satisfaction in either partner due to erection problems. ?A curved penis occurring with erection. The curve may cause pain, or the penis may be too curved to allow for intercourse. ?Never having nighttime or morning erections. ?How is this diagnosed? ?This condition is often diagnosed by: ?Performing a physical exam to find other diseases or specific problems with the penis. ?Asking you detailed questions about the problem. ?Doing tests,  such as: ?Blood tests to check for diabetes mellitus or high cholesterol, or to measure hormone levels. ?Other tests to check for underlying health conditions. ?An ultrasound exam to check for scarring. ?A test to check blood flow to the penis. ?Doing a sleep study at home to measure nighttime erections. ?How is this treated? ?This condition may be treated by: ?Medicines, such as: ?Medicine taken by mouth to help you achieve an erection (oral medicine). ?Hormone replacement therapy to replace low testosterone levels. ?Medicine that is injected into the penis. Your health care provider may instruct you how to give yourself these injections at home. ?Medicine that is delivered with a short applicator tube. The tube is inserted into the opening at the tip of the penis, which is the opening of the urethra. A tiny pellet of medicine is put in the urethra. The pellet dissolves and enhances erectile function. This is also called MUSE (medicated urethral system for erections) therapy. ?Vacuum pump. This is a pump with a ring on it. The pump and ring are placed on the penis and used to create pressure that helps the penis become erect. ?Penile implant surgery. In this procedure, you may receive: ?An inflatable implant. This consists of cylinders, a pump, and a reservoir. The cylinders can be inflated with a fluid that helps to create an erection, and they can be deflated after intercourse. ?A semi-rigid implant. This consists of two silicone rubber rods. The rods provide some rigidity. They are also flexible, so the penis can both curve downward in its normal position and become straight for sexual intercourse. ?Blood vessel surgery to improve blood flow to the penis. During this procedure, a blood vessel from a different part of the body is placed into the penis to allow blood to flow around (bypass) damaged or blocked blood vessels. ?Lifestyle changes,   such as exercising more, losing weight, and quitting smoking. ?Follow  these instructions at home: ?Medicines ? ?Take over-the-counter and prescription medicines only as told by your health care provider. Do not increase the dosage without first discussing it with your health care provider. ?If you are using self-injections, do injections as directed by your health care provider. Make sure you avoid any veins that are on the surface of the penis. After giving an injection, apply pressure to the injection site for 5 minutes. ?Talk to your health care provider about how to prevent headaches while taking ED medicines. These medicines may cause a sudden headache due to the increase in blood flow in your body. ?General instructions ?Exercise regularly, as directed by your health care provider. Work with your health care provider to lose weight, if needed. ?Do not use any products that contain nicotine or tobacco. These products include cigarettes, chewing tobacco, and vaping devices, such as e-cigarettes. If you need help quitting, ask your health care provider. ?Before using a vacuum pump, read the instructions that come with the pump and discuss any questions with your health care provider. ?Keep all follow-up visits. This is important. ?Contact a health care provider if: ?You feel nauseous. ?You are vomiting. ?You get sudden headaches while taking ED medicines. ?You have any concerns about your sexual health. ?Get help right away if: ?You are taking oral or injectable medicines and you have an erection that lasts longer than 4 hours. If your health care provider is unavailable, go to the nearest emergency room for evaluation. An erection that lasts much longer than 4 hours can result in permanent damage to your penis. ?You have severe pain in your groin or abdomen. ?You develop redness or severe swelling of your penis. ?You have redness spreading at your groin or lower abdomen. ?You are unable to urinate. ?You experience chest pain or a rapid heartbeat (palpitations) after taking oral  medicines. ?These symptoms may represent a serious problem that is an emergency. Do not wait to see if the symptoms will go away. Get medical help right away. Call your local emergency services (911 in the [place] Do not drive yourself to the hospital. ?Summary ?Erectile dysfunction (ED) is the inability to get or keep an erection during sexual intercourse. ?This condition is diagnosed based on a physical exam, your symptoms, and tests to determine the cause. Treatment varies depending on the cause and may include medicines, hormone therapy, surgery, or a vacuum pump. ?You may need follow-up visits to make sure that you are using your medicines or devices correctly. ?Get help right away if you are taking or injecting medicines and you have an erection that lasts longer than 4 hours. ?This information is not intended to replace advice given to you by your health care provider. Make sure you discuss any questions you have with your health care provider. ?Document Revised: 08/18/2020 Document Reviewed: 08/18/2020 ?Elsevier Patient Education ? 2023 [employer] ? ?

## 2022-02-24 LAB — URINALYSIS, ROUTINE W REFLEX MICROSCOPIC
Bilirubin, UA: NEGATIVE
Glucose, UA: NEGATIVE
Ketones, UA: NEGATIVE
Leukocytes,UA: NEGATIVE
Nitrite, UA: NEGATIVE
RBC, UA: NEGATIVE
Specific Gravity, UA: 1.02 (ref 1.005–1.030)
Urobilinogen, Ur: 1 mg/dL (ref 0.2–1.0)
pH, UA: 5.5 (ref 5.0–7.5)

## 2022-03-31 ENCOUNTER — Ambulatory Visit: Payer: Medicare PPO | Admitting: Urology

## 2023-02-17 ENCOUNTER — Other Ambulatory Visit: Payer: Self-pay | Admitting: Urology

## 2023-09-20 ENCOUNTER — Other Ambulatory Visit: Payer: Self-pay | Admitting: Urology

## 2023-10-29 ENCOUNTER — Other Ambulatory Visit: Payer: Self-pay | Admitting: Urology

## 2023-12-03 ENCOUNTER — Other Ambulatory Visit: Payer: Self-pay | Admitting: Urology

## 2023-12-30 ENCOUNTER — Other Ambulatory Visit: Payer: Self-pay | Admitting: Urology

## 2024-01-30 ENCOUNTER — Other Ambulatory Visit: Payer: Self-pay | Admitting: Urology

## 2024-03-23 ENCOUNTER — Other Ambulatory Visit: Payer: Self-pay | Admitting: Urology

## 2024-04-22 ENCOUNTER — Other Ambulatory Visit: Payer: Self-pay | Admitting: Urology

## 2024-05-20 ENCOUNTER — Other Ambulatory Visit: Payer: Self-pay | Admitting: Urology

## 2024-06-21 ENCOUNTER — Other Ambulatory Visit: Payer: Self-pay | Admitting: Urology
